# Patient Record
Sex: Female | Born: 1950 | Race: Black or African American | Hispanic: No | Smoking: Never smoker
Health system: Southern US, Community
[De-identification: ages and names within clinical notes are randomized; demographics above are authoritative.]

## PROBLEM LIST (undated history)

## (undated) DIAGNOSIS — I2510 Atherosclerotic heart disease of native coronary artery without angina pectoris: Secondary | ICD-10-CM

## (undated) DIAGNOSIS — K921 Melena: Secondary | ICD-10-CM

## (undated) DIAGNOSIS — L309 Dermatitis, unspecified: Secondary | ICD-10-CM

## (undated) DIAGNOSIS — K219 Gastro-esophageal reflux disease without esophagitis: Secondary | ICD-10-CM

## (undated) DIAGNOSIS — I10 Essential (primary) hypertension: Secondary | ICD-10-CM

## (undated) DIAGNOSIS — M8580 Other specified disorders of bone density and structure, unspecified site: Secondary | ICD-10-CM

## (undated) DIAGNOSIS — D649 Anemia, unspecified: Secondary | ICD-10-CM

## (undated) DIAGNOSIS — K649 Unspecified hemorrhoids: Secondary | ICD-10-CM

## (undated) DIAGNOSIS — M1990 Unspecified osteoarthritis, unspecified site: Secondary | ICD-10-CM

## (undated) DIAGNOSIS — Z9289 Personal history of other medical treatment: Secondary | ICD-10-CM

## (undated) DIAGNOSIS — E785 Hyperlipidemia, unspecified: Secondary | ICD-10-CM

## (undated) HISTORY — DX: Atherosclerotic heart disease of native coronary artery without angina pectoris: I25.10

## (undated) HISTORY — DX: Melena: K92.1

## (undated) HISTORY — DX: Essential (primary) hypertension: I10

## (undated) HISTORY — DX: Hyperlipidemia, unspecified: E78.5

## (undated) HISTORY — DX: Unspecified hemorrhoids: K64.9

## (undated) HISTORY — DX: Unspecified osteoarthritis, unspecified site: M19.90

## (undated) HISTORY — DX: Anemia, unspecified: D64.9

## (undated) HISTORY — DX: Gastro-esophageal reflux disease without esophagitis: K21.9

## (undated) HISTORY — PX: ESOPHAGOGASTRODUODENOSCOPY: SHX1529

## (undated) HISTORY — DX: Other specified disorders of bone density and structure, unspecified site: M85.80

## (undated) HISTORY — DX: Personal history of other medical treatment: Z92.89

## (undated) HISTORY — DX: Dermatitis, unspecified: L30.9

## (undated) HISTORY — PX: ABDOMINAL HYSTERECTOMY: SHX81

## (undated) HISTORY — PX: COLONOSCOPY: SHX174

---

## 1998-02-23 ENCOUNTER — Other Ambulatory Visit: Admission: RE | Admit: 1998-02-23 | Discharge: 1998-02-23 | Payer: Self-pay | Admitting: Obstetrics and Gynecology

## 1999-03-14 ENCOUNTER — Other Ambulatory Visit: Admission: RE | Admit: 1999-03-14 | Discharge: 1999-03-14 | Payer: Self-pay | Admitting: Obstetrics and Gynecology

## 2000-07-29 ENCOUNTER — Other Ambulatory Visit: Admission: RE | Admit: 2000-07-29 | Discharge: 2000-07-29 | Payer: Self-pay | Admitting: Obstetrics and Gynecology

## 2001-07-10 ENCOUNTER — Other Ambulatory Visit: Admission: RE | Admit: 2001-07-10 | Discharge: 2001-07-10 | Payer: Self-pay | Admitting: Obstetrics and Gynecology

## 2002-11-05 ENCOUNTER — Other Ambulatory Visit: Admission: RE | Admit: 2002-11-05 | Discharge: 2002-11-05 | Payer: Self-pay | Admitting: Obstetrics and Gynecology

## 2004-10-07 ENCOUNTER — Encounter: Admission: RE | Admit: 2004-10-07 | Discharge: 2004-10-07 | Payer: Self-pay | Admitting: Family Medicine

## 2005-03-04 ENCOUNTER — Inpatient Hospital Stay (HOSPITAL_COMMUNITY): Admission: EM | Admit: 2005-03-04 | Discharge: 2005-03-07 | Payer: Self-pay | Admitting: Emergency Medicine

## 2005-03-05 ENCOUNTER — Encounter (INDEPENDENT_AMBULATORY_CARE_PROVIDER_SITE_OTHER): Payer: Self-pay | Admitting: *Deleted

## 2005-12-18 ENCOUNTER — Ambulatory Visit: Payer: Self-pay | Admitting: Family Medicine

## 2006-04-23 ENCOUNTER — Ambulatory Visit: Payer: Self-pay | Admitting: Family Medicine

## 2006-07-04 ENCOUNTER — Ambulatory Visit: Payer: Self-pay | Admitting: Family Medicine

## 2006-07-09 ENCOUNTER — Encounter: Admission: RE | Admit: 2006-07-09 | Discharge: 2006-07-09 | Payer: Self-pay | Admitting: Family Medicine

## 2006-07-09 DIAGNOSIS — Z9289 Personal history of other medical treatment: Secondary | ICD-10-CM

## 2006-07-09 HISTORY — DX: Personal history of other medical treatment: Z92.89

## 2007-01-16 ENCOUNTER — Ambulatory Visit: Payer: Self-pay | Admitting: Family Medicine

## 2007-02-17 ENCOUNTER — Ambulatory Visit: Payer: Self-pay | Admitting: Family Medicine

## 2007-06-02 ENCOUNTER — Ambulatory Visit: Payer: Self-pay | Admitting: Family Medicine

## 2007-07-30 ENCOUNTER — Ambulatory Visit: Payer: Self-pay | Admitting: Family Medicine

## 2007-08-20 ENCOUNTER — Ambulatory Visit: Payer: Self-pay | Admitting: Family Medicine

## 2008-05-11 ENCOUNTER — Ambulatory Visit: Payer: Self-pay | Admitting: Family Medicine

## 2008-11-16 ENCOUNTER — Encounter: Admission: RE | Admit: 2008-11-16 | Discharge: 2008-11-16 | Payer: Self-pay | Admitting: Family Medicine

## 2009-07-29 ENCOUNTER — Ambulatory Visit: Payer: Self-pay | Admitting: Family Medicine

## 2009-08-15 ENCOUNTER — Ambulatory Visit: Payer: Self-pay | Admitting: Family Medicine

## 2009-10-18 ENCOUNTER — Ambulatory Visit: Payer: Self-pay | Admitting: Family Medicine

## 2010-07-31 ENCOUNTER — Encounter (INDEPENDENT_AMBULATORY_CARE_PROVIDER_SITE_OTHER): Payer: BC Managed Care – PPO | Admitting: Family Medicine

## 2010-07-31 DIAGNOSIS — J309 Allergic rhinitis, unspecified: Secondary | ICD-10-CM

## 2010-07-31 DIAGNOSIS — M899 Disorder of bone, unspecified: Secondary | ICD-10-CM

## 2010-07-31 DIAGNOSIS — Z0000 Encounter for general adult medical examination without abnormal findings: Secondary | ICD-10-CM

## 2010-07-31 DIAGNOSIS — I10 Essential (primary) hypertension: Secondary | ICD-10-CM

## 2010-07-31 DIAGNOSIS — R0609 Other forms of dyspnea: Secondary | ICD-10-CM

## 2010-07-31 DIAGNOSIS — R0989 Other specified symptoms and signs involving the circulatory and respiratory systems: Secondary | ICD-10-CM

## 2010-07-31 DIAGNOSIS — M949 Disorder of cartilage, unspecified: Secondary | ICD-10-CM

## 2010-09-22 NOTE — H&P (Signed)
NAME:  [name] [name], [name] [name]                ACCOUNT NO.:  [id number]   MEDICAL RECORD NO.:  [id number]          PATIENT TYPE:  EMS   LOCATION:  MAJO                         FACILITY:  [facility]   PHYSICIAN:  Mobolaji B. Bakare, M.D.DATE OF BIRTH:  [birth year]   DATE OF ADMISSION:  03/03/2005  DATE OF DISCHARGE:                                HISTORY & PHYSICAL   Primary care physician:  Dr. Susann Givens.   CHIEF COMPLAINT:  Chest pain, started about 8:00 a.m. yesterday.   HISTORY OF PRESENTING COMPLAINT:  [name] [name] is a 60 year old African-  American [name] with history of hypertension and hyperlipidemia.  She was in  her usual state until about 8:00 a.m. yesterday morning.  After she had  woken up, she started experiencing retrosternal chest pain which she rated  as 5/10.  She took some Tums and it got better, a little bit, but still  lingered on until about 2:00 p.m. and became worse again and she decided to  come to the emergency department.  It was associated with a feeling of  numbness in her right arm and right side of face.  There was no nausea or  vomiting, palpitations or shortness of breath.  She exercises regularly and  does some aerobics.  She has been able to lose about 16 pounds since May  from regular exercise.  This is intended to help her control  hypercholesterolemia.  She has a brother who had a myocardial infarction at  the age of 16 and mom died of MI at the age of 35.   Currently, she rates the chest pain as 2/10.  EKG is normal and cardiac  enzymes at the point of care are normal.   REVIEW OF SYSTEMS:  HEENT:  No headaches.  RESPIRATORY:  No shortness of  breath or dyspnea on exertion.  GASTROINTESTINAL:  No abdominal pain,  diarrhea, constipation, vomiting.   PAST MEDICAL HISTORY:  1.  Hypertension.  This has been similarly uncontrolled in the last couple      of weeks.  Her blood pressure has been 188/104 to 170/105.  She has had      to double up on Ziac today.  2.   History of asthma in childhood.  3.  History of blood clots at the age of 89.  Could not really give      understandable details of this episode.  4.  Hyperlipidemia.  Using diet to control.   PAST SURGICAL HISTORY:  None.   MEDICATIONS:  Ziac 2.5/6.25 one p.o. daily.   ALLERGIES:  PENICILLIN, unclear the type of allergy.   FAMILY HISTORY:  Father is deceased.  He died from throat cancer.  He was a  smoker.  Mother died from myocardial infarction at the age of __5.  One  brother has had a myocardial infarction at the age of 24.  Two of her  siblings are diabetic and hypertensive.   SOCIAL HISTORY:  She does not smoke cigarettes.  Does not drink alcohol.  She exercises regularly.  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   VITALS:  Temperature 98.6, blood pressure 193/113, rechecked, it was  170/100.  Pulse of 55, respiratory rate of 20 with O2 SATs of 98%.   PHYSICAL EXAMINATION:  GENERAL:  She is alert and oriented to time, place  and person.  HEENT:  Normocephalic, atraumatic head.  Pupils equal, round and reactive to  light.  Extraocular muscle movement intact.  NECK:  No carotid bruit.  No elevated JVD.  LUNGS:  Clear clinically to auscultation.  CARDIOVASCULAR:  S1, S2.  Regular.  No murmur, no rub.  ABDOMEN:  Nondistended.  Soft, nontender.  No hepatosplenomegaly.  Bowel  sounds present.  EXTREMITIES:  No cyanosis, no pedal edema.  Dorsalis pedis pulses palpable  bilaterally, 2+.  CENTRAL NERVOUS SYSTEM:  No focal neurological deficit.   INITIAL LABORATORY DATA:  White cells 4.7, hemoglobin 13.9, hematocrit 40.7,  MCV 90.1, platelets 235, neutrophils 39%, lymphocytes 45%.  Troponin less  than 0.5.  CK-MB less than 1.  Sodium 156, potassium 4.1, chloride 104,  bicarb 29, BUN 15, creatinine 1.1, calcium 9.2.  Total protein 6.5.  Albumin  4.2.  AST 19, ALT 14.  Alkaline phosphatase 61, total bilirubin 0.6.  Lipase  30.   Chest  x-ray:  No acute abnormality.   EKG:  Normal sinus rhythm, heart rate of 61.   ASSESSMENT AND PLAN:  1.  [name] [name] with history of      hypertension, hyperlipidemia and family history of myocardial      infarction, presenting with atypical chest pain.  Will admit to      telemetry and rule out myocardial infarction with serial cardiac      enzymes, better control blood pressure.  Add aspirin 325 mg p.o. daily.      Will give Protonix 40 mg p.o. daily for possibility of reflux as a cause      of chest pain.  Will start on nitroglycerin paste, 1/2 inch q.4h.      Obtain two-dimensional echocardiogram.  If ruled out for myocardial      infarction, would consider Cardiolite stress test.  2.  Hypertension.  This is uncontrolled.  She is currently on Ziac 2.5/6.25.      Heart rate is low-normal.  Will not increase Ziac dose.  Will add      lisinopril 40 mg p.o. daily.  3.  Hyperlipidemia.  Chest fasting lipid profile.      Mobolaji B. Corky Downs, M.D.  Electronically Signed     MBB/MEDQ  D:  03/04/2005  T:  03/04/2005  Job:  [id number]   cc:   Sharlot Gowda, M.D.  Fax: [phone]

## 2010-09-22 NOTE — Cardiovascular Report (Signed)
NAME:  [name], [name]                ACCOUNT NO.:  [id number]   MEDICAL RECORD NO.:  [id number]          PATIENT TYPE:  INP   LOCATION:  5524                         FACILITY:  [facility]   PHYSICIAN:  Cristy Hilts. Jacinto Halim, MD       DATE OF BIRTH:  1951-04-14   DATE OF PROCEDURE:  03/04/2005  DATE OF DISCHARGE:                              CARDIAC CATHETERIZATION   REFERRING PHYSICIAN:  Sharlot Gowda, M.D.   PROCEDURE PREFORMED:  1.  Left ventriculography.  2.  Selective right and left coronary arteriography.  3.  Abdominal aortogram.  4.  Right femoral angiography.  5.  Closure of right femoral access with StarClose.   INDICATIONS:  [name] [name] is a 60 year old female with a history of  hyperlipidemia, hypertension, family history of premature coronary artery  disease, who was admitted to the hospital with chest pain.  She was referred  for cardiac catheterization for a definitive diagnosis of coronary disease.   HEMODYNAMIC DATA:  1.  The left ventricular pressures were 177/6 with an end diastolic pressure      of 13-mmHg.  2.  The aortic pressures were 177/99 with a mean of 133-mmHg.  There was no      pressure gradient across the aortic valve.   ANGIOGRAPHIC DATA:  1.  Left ventricle.  The left ventricular systolic function was normal and      the ejection fraction was estimated at 60-65%.  There was no significant      mitral regurgitation.  2.  Right coronary artery.  The right coronary artery is a large caliber      vessel, dominant vessel.  It gives origin to a large PDA.  It is normal.  3.  Left main.  The left main is a large caliber vessel.  It is normal.  4.  Circumflex.  The circumflex is a large caliber vessel.  It is smooth.      It gives origin to a high obtuse marginal-1 and a moderate to large size      obtuse marginal-2 and continues as an obtuse marginal-3.  The obtuse      marginal-2 has a 20% ostial stenosis.  5.  LAD.  The LAD is a large caliber vessel in the  proximal segment.  It      gives origin to a very small diagonal-1 which is severely diffusely      diseased with the ostial 40-50% stenosis.  The distal LAD has diffuse      luminal irregularity constituting 30-50% luminal irregularity with mild      haziness distally.  The LAD also gives origin to a moderate size      diagonal-2.   ABDOMINAL AORTOGRAM:  Abdominal aortogram revealed the presence of two renal  arteries on either side.  They were widely patent.   IMPRESSIONS:  1.  Normal left ventricular systolic function, ejection fraction 65%.  2.  Moderate diffuse disease, especially of the left anterior descending      artery, constituting 30-50% stenosis in the mid to distal left anterior  descending artery.  Mild haziness is also noted in one of the distal      lesions, however, there is no evidence of ulcerative lesion or a      thrombotic lesion.  Diagonal-1 is very small and diffusely diseased.  3.  Patent renal arteries.   RECOMMENDATIONS:  The patient has uncontrolled hypertension, uncontrolled  hyperlipidemia with an LDL of 200.  She is the one that is extremely high  risk for progression of coronary artery disease, unstable angina/myocardial  infarction in future.  Given the diffuse nature of her disease and  noncritical disease especially of the LAD, continued aggressive risk factor  modification with beta-blockers, ACE inhibitors, aspirin, and lipid-lowering  is indicated with a goal of LDL less than 100, potentially closer to 70.  The patient can be discharged home and will have an outpatient followup with  Dr. Julieanne Manson.  The patient has been started on Metoprolol 25 b.i.d.,  along with Vytorin 10/40 p.o. q.h.s., along with Lisinopril.   A total of 90 cc of contrast was utilized for diagnostic angiography.  No  immediate complications.   TECHNIQUE OF THE PROCEDURE:  Under usual sterile precautions using a 6-  French right femoral arterial access, a 6-French  multiple B2 catheter was  advanced into the ascending aorta over a 0.035 inch wire.  The catheter was  gently advanced into the left ventricle and left ventricular pressures were  monitored.  Hand contrast injection into the left ventricle was performed  both in LAO and RAO projection.  Catheter was flushed with saline, pulled  back into the ascending aorta and pressure gradient across the aortic valve  was monitored.  The right coronary artery was selectively engaged and  angiography was performed.  In a similar fashion, the left coronary artery  was selectively engaged and angiography was performed.  Then the catheter  was pulled back into the abdominal aorta and abdominal aortogram was  performed.  Then the catheter was pulled out of the body in the usual  fashion and right femoral angiography was performed through the arterial  access sheath and the access was closed with StarClose with excellent  hemostasis.  No immediate complications noted.      Cristy Hilts. Jacinto Halim, MD  Electronically Signed     JRG/MEDQ  D:  03/06/2005  T:  03/06/2005  Job:  [id number]   cc:   Sharlot Gowda, M.D.  Fax: [phone]   Thereasa Solo. Little, M.D.  Fax: [phone]

## 2010-09-22 NOTE — Discharge Summary (Signed)
NAME:  [name] [name], [name] [name]                ACCOUNT NO.:  [id number]   MEDICAL RECORD NO.:  [id number]          PATIENT TYPE:  INP   LOCATION:  5524                         FACILITY:  [facility]   PHYSICIAN:  Hettie Holstein, D.O.    DATE OF BIRTH:  1951-02-01   DATE OF ADMISSION:  03/03/2005  DATE OF DISCHARGE:  03/07/2005                                 DISCHARGE SUMMARY   PRIMARY CARE PHYSICIAN:  Sharlot Gowda, M.D.   CARDIOLOGIST:  Cristy Hilts. Jacinto Halim, M.D.   PRINCIPAL DIAGNOSIS:  Chest pain.   DIAGNOSES AT TIME OF DISCHARGE:  1.  Chest pain without evidence of acute ischemic event during this      hospitalization, status post cardiac catheterization revealing      nonobstructive coronary disease, no interventions performed.  2.  Hyperlipidemia.  3.  Hypertension.   MEDICATIONS ON DISCHARGE:  1.  Ziac 2.5/6.25 mg one daily as before.  2.  Iron supplement as before.  3.  Allegra as before.  4.  Claritin as before.  5.  Aspirin as before.  6.  Lisinopril 40 mg daily.  This was new.  7.  Zocor 20 mg daily.   A follow-up comprehensive metabolic panel within one week of discharge.   DISPOSITION:  She was instructed to follow up with Dr. Jacinto Halim on November 20  at 11:45 and with Dr. Susann Givens at 11:15 on November 8.   HISTORY OF PRESENTING ILLNESS:  For full details, please refer to the H&P as  dictated by Mobolaji B. Corky Downs, M.D.  However, briefly, Ms. [name] [name] is a 60-  year-old African-American female with a history of hypertension and  hyperlipidemia, who was in her usual state of health until around 8 o'clock  the morning previous to admission.  After she had woken up, she started  experiencing retrosternal chest pain, which she rated as a 5/10.  She took  some Tums and it improved and still lingered until around 2 p.m. and became  worse, and she decided to come to the emergency department.  This was  associated with a feeling of numbness in her right arm and right side of her  face.  There was  no nausea or vomiting, palpitations or shortness of breath.  She reported regular exercise and aerobics.  She had been able to lose about  16 pounds since May with exercise.  Her brother had a history of MI at age  45, and her mother died with an MI at age 75.  Her EKG on presentation was  normal, and point of care markers were unremarkable.   HOSPITAL COURSE:  [name] was admitted for a rule-out of acute ischemic  event.  She underwent cardiology consultation with [facility], who performed cardiac catheterization, and underwent cycling of  her cardiac markers as well as 2-D echocardiogram.  The 2-D echocardiogram  revealed normal LV systolic function with ejection fraction of 55-65% and no  significant valvular abnormalities were prominent.  Her point of care  markers were negative.  Her lipid profile revealed a total cholesterol of  281,  LDL cholesterol of 200, HDL of 63, and her BUN was 13 and creatinine  was 1.1.  Cardiac catheterization performed revealed a nonobstructive  coronary disease and she was scheduled for follow-up in the outpatient  setting with cardiology.  She was discharged in medically stable condition.      Hettie Holstein, D.O.  Electronically Signed     ESS/MEDQ  D:  05/15/2005  T:  05/16/2005  Job:  [id number]

## 2010-09-27 ENCOUNTER — Other Ambulatory Visit: Payer: Self-pay | Admitting: Family Medicine

## 2010-10-30 ENCOUNTER — Other Ambulatory Visit: Payer: Self-pay | Admitting: Family Medicine

## 2010-11-14 ENCOUNTER — Other Ambulatory Visit: Payer: Self-pay | Admitting: Obstetrics and Gynecology

## 2010-11-14 DIAGNOSIS — R928 Other abnormal and inconclusive findings on diagnostic imaging of breast: Secondary | ICD-10-CM

## 2010-11-15 ENCOUNTER — Ambulatory Visit
Admission: RE | Admit: 2010-11-15 | Discharge: 2010-11-15 | Disposition: A | Discharge status: Home / Self Care | Payer: BC Managed Care – PPO | Source: Ambulatory Visit | Attending: Obstetrics and Gynecology | Admitting: Obstetrics and Gynecology

## 2010-11-15 DIAGNOSIS — R928 Other abnormal and inconclusive findings on diagnostic imaging of breast: Secondary | ICD-10-CM

## 2010-11-28 ENCOUNTER — Other Ambulatory Visit: Payer: Self-pay | Admitting: Family Medicine

## 2011-01-05 ENCOUNTER — Other Ambulatory Visit: Payer: Self-pay | Admitting: Family Medicine

## 2011-02-07 ENCOUNTER — Other Ambulatory Visit: Payer: Self-pay | Admitting: Family Medicine

## 2011-02-26 ENCOUNTER — Encounter: Payer: Self-pay | Admitting: Medical

## 2011-02-26 ENCOUNTER — Ambulatory Visit (INDEPENDENT_AMBULATORY_CARE_PROVIDER_SITE_OTHER): Payer: BC Managed Care – PPO | Admitting: Medical

## 2011-02-26 ENCOUNTER — Encounter: Payer: Self-pay | Admitting: Family Medicine

## 2011-02-26 VITALS — BP 132/82 | HR 60 | Temp 98.4°F | Resp 16 | Ht 65.5 in | Wt 192.5 lb

## 2011-02-26 DIAGNOSIS — K921 Melena: Secondary | ICD-10-CM

## 2011-02-26 DIAGNOSIS — R6881 Early satiety: Secondary | ICD-10-CM

## 2011-02-26 DIAGNOSIS — R109 Unspecified abdominal pain: Secondary | ICD-10-CM

## 2011-02-26 NOTE — Progress Notes (Signed)
Subjective:   HPI  [name] [name] is a 60 y.o. female who presents for 2 week hx/o stomach discomfort, gets feeling of stomach fullness, early satiety with water, minimal food intake, no appetite but at times feels hunger.  She notes 5 lb weight loss unexpected over the weekend.   She has hx/o constipation, but has been controlled with daily stool softeners.  She has hx/o GERD but this is controlled on Nexium.  She says these symptoms don't seem like her prior GERD symptoms . She is worried because her husband died of stomach cancer and had similar symptoms.  She had EGD and Colonoscopy 2008 with Dr. Loreta Ave.  She wasn't sure when she was suppose to repeat this.  She notes that she has been treated for H. Pylori prior.  She denies alcohol or tobacco use.  No recent medication changes.  Denies difficulty swallowing.   No coughing or vomiting blood.  However, she had a few episodes of blood in stool in 3/12 that was attributed to hemorrhoids, but in the last few months, she has been seeing frank blood on the toilet paper and in the stool daily.  No other aggravating or relieving factors.    No other c/o.  Review of Systems Constitutional: denies fever,- chills, sweats,+ unexpected weight change, +fatigue ENT: no runny nose, ear pain, sore throat Cardiology: + chest pain,- palpitations,- edema Respiratory: denies cough, shortness of breath, wheezing Gastroenterology:  +nausea,denies abdominal pains, vomiting, diarrhea, constipation Hematology: denies bleeding or bruising problems Musculoskeletal: denies arthralgias, myalgias, joint swelling, back pain Ophthalmology: + vision changes Urology: denies dysuria, difficulty urinating, hematuria, urinary frequency, urgency Neurology: no headache,+ weakness,- tingling-, numbness    Allergies  Allergen Reactions  . Penicillins    Pertinent past medical history: Hemorrhoids, constipation, GERD, EGD and Colonoscopy 2008, Dr. Loreta Ave  Pertinent social history:  husband died few years ago of stomach cancer, otherwise negative.  Reviewed their medical, surgical, family, social, medication, and allergy history and updated chart as appropriate.    Objective:   Physical Exam  Filed Vitals:   02/26/11 1211  BP: 132/82  Pulse: 60  Temp: 98.4 F (36.9 C)  Resp: 16    General appearance: alert, no distress, WD/WN, black female HEENT: normocephalic, sclerae anicteric, TMs pearly, nares patent, no discharge or erythema, pharynx normal Oral cavity: MMM, no lesions Neck: supple, no lymphadenopathy, no thyromegaly, no masses Heart: RRR, normal S1, S2, no murmurs Lungs: CTA bilaterally, no wheezes, rhonchi, or rales Abdomen: +bs, soft, mild generalized tenderness, non distended, no masses, no hepatomegaly, no splenomegaly Pulses: 2+ symmetric, upper and lower extremities, normal cap refill   Assessment and Plan :    Encounter Diagnoses  Name Primary?  . Abdominal discomfort Yes  . Blood in stool   . Early satiety    I reviewed her EGD and colonoscopy results from 2008 which were relatively unremarkable.  She has been continued on Nexium with good improvement of GERD.  She has continued to have constipation that is relieved with stool softeners daily.  However, she has new symptoms of weight loss, early satiety, anorexia, 5lb weight loss in the last 2 weeks, and hx/o increasing regularity if blood in stool for months.  Thus, labs today and referral back to Dr. Lavonia Drafts.    Follow-up in with referral to GI.

## 2011-02-26 NOTE — Progress Notes (Signed)
  Subjective:    Patient ID: [name] [name], female    DOB: April 22, 1951, 60 y.o.   MRN: [id number]  HPI    Review of Systems     Objective:   Physical Exam        Assessment & Plan:

## 2011-02-26 NOTE — Patient Instructions (Signed)
We will call with lab results and referral information.

## 2011-02-27 LAB — COMPREHENSIVE METABOLIC PANEL
ALT: 20 U/L (ref 0–35)
AST: 23 U/L (ref 0–37)
Albumin: 4.8 g/dL (ref 3.5–5.2)
Alkaline Phosphatase: 71 U/L (ref 39–117)
BUN: 14 mg/dL (ref 6–23)
CO2: 25 mEq/L (ref 19–32)
Calcium: 10 mg/dL (ref 8.4–10.5)
Chloride: 106 mEq/L (ref 96–112)
Creat: 1.08 mg/dL (ref 0.50–1.10)
Glucose, Bld: 92 mg/dL (ref 70–99)
Potassium: 4.5 mEq/L (ref 3.5–5.3)
Sodium: 142 mEq/L (ref 135–145)
Total Bilirubin: 0.6 mg/dL (ref 0.3–1.2)
Total Protein: 7.1 g/dL (ref 6.0–8.3)

## 2011-02-27 LAB — CBC WITH DIFFERENTIAL/PLATELET
Basophils Absolute: 0 10*3/uL (ref 0.0–0.1)
Basophils Relative: 1 % (ref 0–1)
Eosinophils Absolute: 0.2 10*3/uL (ref 0.0–0.7)
Eosinophils Relative: 4 % (ref 0–5)
HCT: 38 % (ref 36.0–46.0)
Hemoglobin: 12.3 g/dL (ref 12.0–15.0)
Lymphocytes Relative: 42 % (ref 12–46)
Lymphs Abs: 2.2 10*3/uL (ref 0.7–4.0)
MCH: 29.1 pg (ref 26.0–34.0)
MCHC: 32.4 g/dL (ref 30.0–36.0)
MCV: 90 fL (ref 78.0–100.0)
Monocytes Absolute: 0.4 10*3/uL (ref 0.1–1.0)
Monocytes Relative: 8 % (ref 3–12)
Neutro Abs: 2.4 10*3/uL (ref 1.7–7.7)
Neutrophils Relative %: 45 % (ref 43–77)
Platelets: 258 10*3/uL (ref 150–400)
RBC: 4.22 MIL/uL (ref 3.87–5.11)
RDW: 14.4 % (ref 11.5–15.5)
WBC: 5.2 10*3/uL (ref 4.0–10.5)

## 2011-03-09 LAB — HM COLONOSCOPY

## 2011-04-02 ENCOUNTER — Ambulatory Visit (INDEPENDENT_AMBULATORY_CARE_PROVIDER_SITE_OTHER): Payer: BC Managed Care – PPO | Admitting: Surgery

## 2011-04-11 ENCOUNTER — Ambulatory Visit (INDEPENDENT_AMBULATORY_CARE_PROVIDER_SITE_OTHER): Payer: BC Managed Care – PPO | Admitting: General Surgery

## 2011-04-11 ENCOUNTER — Encounter (INDEPENDENT_AMBULATORY_CARE_PROVIDER_SITE_OTHER): Payer: Self-pay | Admitting: General Surgery

## 2011-04-11 VITALS — BP 148/88 | HR 68 | Temp 98.4°F | Resp 16 | Ht 66.0 in | Wt 198.4 lb

## 2011-04-11 DIAGNOSIS — K648 Other hemorrhoids: Secondary | ICD-10-CM

## 2011-04-11 NOTE — Progress Notes (Signed)
Patient ID: [name] [name], female   DOB: 04-07-1951, 60 y.o.   MRN: [id number]  Chief Complaint  Patient presents with  . New Evaluation    eval of hemorrhoids     HPI [name] [name] is a 60 y.o. female.   HPI This patient is referred by Dr. Loreta Ave for evaluation of internal and external hemorrhoids. These are symptomatic with some occasional discomfort but her main complaint is bleeding which occurs weekly or biweekly. She describes this as bright blood mainly while wiping with some dripping into the bowl. She reports a heavy episode of bleeding earlier in the year in the spring which saturated some paper towels which had been placed in her pants. She takes a stool softener daily for her bowels but if she does not take a stool softener she is constipated. She takes a stool softener she moves her bowels daily and normal caliber. She was also given a high fiber diet recommendation by Dr. Loreta Ave and she started to take more fiber. She has a history of reflux and recently underwent EGD which was reported normal as well as a colonoscopy and she reports 2 polyps being removed which were nonmalignant.  Past Medical History  Diagnosis Date  . Allergy   . Arthritis   . Hypertension   . Hyperlipidemia   . Osteopenia   . ASHD (arteriosclerotic heart disease)   . Eczema   . GERD (gastroesophageal reflux disease)   . Hemorrhoid   . Clotting disorder   . Anemia     Past Surgical History  Procedure Date  . Abdominal hysterectomy   . Colonoscopy 2008    Dr. Loreta Ave  . Esophagogastroduodenoscopy 2008    Dr. Loreta Ave    Family History  Problem Relation Age of Onset  . Heart disease Mother   . Cancer Father     lung    Social History History  Substance Use Topics  . Smoking status: Never Smoker   . Smokeless tobacco: Never Used  . Alcohol Use: No    Allergies  Allergen Reactions  . Penicillins     Current Outpatient Prescriptions  Medication Sig Dispense Refill  . atorvastatin (LIPITOR) 40  MG tablet Take 40 mg by mouth daily.        . bisoprolol-hydrochlorothiazide (ZIAC) 5-6.25 MG per tablet Take 1 tablet by mouth daily.        Marland Kitchen DEXILANT 60 MG capsule       . felodipine (PLENDIL) 10 MG 24 hr tablet TAKE 1 TABLET BY MOUTH ONCE DAILY  30 tablet  9  . Multiple Vitamin (MULTIVITAMIN) capsule Take 1 capsule by mouth daily.        Rolene Arbour BOWEL PREP SOLN         Review of Systems Review of Systems  Gastrointestinal: Positive for constipation.  Hematological: Bruises/bleeds easily.  All other systems reviewed and are negative.    Blood pressure 148/88, pulse 68, temperature 98.4 F (36.9 C), temperature source Temporal, resp. rate 16, height 5\' 6"  (1.676 m), weight 198 lb 6 oz (89.982 kg).  Physical Exam Physical Exam  Vitals reviewed. Constitutional: She is oriented to person, place, and time. She appears well-developed and well-nourished. No distress.  HENT:  Head: Normocephalic and atraumatic.  Eyes: Conjunctivae are normal. Pupils are equal, round, and reactive to light. No scleral icterus.  Neck: Normal range of motion. No tracheal deviation present.  Pulmonary/Chest: Effort normal. No stridor. No respiratory distress.  Abdominal: Soft. Bowel sounds are  normal. She exhibits no distension and no mass. There is no tenderness. There is no rebound and no guarding.  Genitourinary:       She has circumferential internal and external hemorrhoid disease but no evidence of active bleeding or thrombosis. No evidence of abscess or anal fissure. Anoscopic exam did reveal internal hemorrhoids but no internal masses.  Musculoskeletal: Normal range of motion. She exhibits no edema and no tenderness.  Neurological: She is alert and oriented to person, place, and time.  Skin: Skin is warm and dry. No rash noted. She is not diaphoretic. No erythema. No pallor.  Psychiatric: She has a normal mood and affect. Her behavior is normal. Judgment and thought content normal.    Data  Reviewed   Assessment    internal and external hemorrhoids with bleeding  She certainly has notable internal and external hemorrhoids with intermittent bleeding. She is not very symptomatic otherwise. She has circumferential disease which complicates the surgical management of this problem. I do not think that she would be a good candidate for open hemorrhoidectomy unless this was done as a staged procedure. She would be at high risk for stricture if this were done. She would likely be a candidate for a stapled hemorrhoidectomy however, I'm not sure if she would get good enough resolution of her external component with this. She has just recently started a high-fiber diet as prescribed by her gastroenterologist which I agree is key for management of this.we discussed medical management as well as surgical options and I think that she is leaning toward continued conservative management at this time.    Plan    She will continue with high-fiber diet and he recommended 20-30 g of fiber daily, increased water intake, and continued stool softeners. She was offered surgical management since she has already completed some conservative treatments, but at this time she is going to continue with medical management. If she continues to be symptomatic or if bleeding persists or if she changes her mind, we would be happy to discuss surgical options again. I recommended that she follow up in 4 weeks if her symptoms do not really improved with medical treatment.       [name] 04/11/2011, 9:29 AM

## 2011-06-13 ENCOUNTER — Other Ambulatory Visit: Payer: Self-pay | Admitting: Family Medicine

## 2011-08-17 ENCOUNTER — Telehealth: Payer: Self-pay | Admitting: Family Medicine

## 2011-08-17 DIAGNOSIS — I10 Essential (primary) hypertension: Secondary | ICD-10-CM

## 2011-08-17 MED ORDER — BISOPROLOL-HYDROCHLOROTHIAZIDE 5-6.25 MG PO TABS: 1.0000 | ORAL_TABLET | Freq: Every day | ORAL | Status: DC

## 2011-08-17 NOTE — Telephone Encounter (Signed)
Done

## 2011-09-04 ENCOUNTER — Other Ambulatory Visit: Payer: Self-pay | Admitting: Family Medicine

## 2011-10-06 LAB — HM DEXA SCAN

## 2011-10-06 LAB — HM MAMMOGRAPHY: HM Mammogram: NORMAL

## 2011-10-09 ENCOUNTER — Other Ambulatory Visit: Payer: Self-pay | Admitting: Family Medicine

## 2011-10-11 ENCOUNTER — Telehealth: Payer: Self-pay | Admitting: Family Medicine

## 2011-10-11 MED ORDER — ATORVASTATIN CALCIUM 40 MG PO TABS: 40.0000 mg | ORAL_TABLET | Freq: Every day | ORAL | Status: DC

## 2011-10-11 NOTE — Telephone Encounter (Signed)
Filled lipitor 40mg  with only a 30 day supply to [facility]

## 2011-10-16 ENCOUNTER — Encounter: Payer: Self-pay | Admitting: Medical

## 2011-10-16 ENCOUNTER — Ambulatory Visit (INDEPENDENT_AMBULATORY_CARE_PROVIDER_SITE_OTHER): Payer: BC Managed Care – PPO | Admitting: Medical

## 2011-10-16 VITALS — BP 142/90 | HR 60 | Temp 98.1°F | Resp 16 | Ht 65.25 in | Wt 185.0 lb

## 2011-10-16 DIAGNOSIS — Z23 Encounter for immunization: Secondary | ICD-10-CM

## 2011-10-16 DIAGNOSIS — K649 Unspecified hemorrhoids: Secondary | ICD-10-CM

## 2011-10-16 DIAGNOSIS — I10 Essential (primary) hypertension: Secondary | ICD-10-CM

## 2011-10-16 DIAGNOSIS — K219 Gastro-esophageal reflux disease without esophagitis: Secondary | ICD-10-CM | POA: Insufficient documentation

## 2011-10-16 DIAGNOSIS — Z0000 Encounter for general adult medical examination without abnormal findings: Secondary | ICD-10-CM

## 2011-10-16 DIAGNOSIS — M8580 Other specified disorders of bone density and structure, unspecified site: Secondary | ICD-10-CM | POA: Insufficient documentation

## 2011-10-16 DIAGNOSIS — M899 Disorder of bone, unspecified: Secondary | ICD-10-CM

## 2011-10-16 DIAGNOSIS — E785 Hyperlipidemia, unspecified: Secondary | ICD-10-CM | POA: Insufficient documentation

## 2011-10-16 LAB — CBC WITH DIFFERENTIAL/PLATELET
Basophils Absolute: 0 10*3/uL (ref 0.0–0.1)
Basophils Relative: 1 % (ref 0–1)
Eosinophils Absolute: 0.2 10*3/uL (ref 0.0–0.7)
Eosinophils Relative: 5 % (ref 0–5)
Hemoglobin: 12.5 g/dL (ref 12.0–15.0)
Lymphs Abs: 2.1 10*3/uL (ref 0.7–4.0)
MCH: 28.7 pg (ref 26.0–34.0)
MCHC: 32.1 g/dL (ref 30.0–36.0)
MCV: 89.2 fL (ref 78.0–100.0)
Monocytes Absolute: 0.5 10*3/uL (ref 0.1–1.0)
Monocytes Relative: 8 % (ref 3–12)
Neutro Abs: 2.5 10*3/uL (ref 1.7–7.7)
Neutrophils Relative %: 47 % (ref 43–77)
Platelets: 259 10*3/uL (ref 150–400)
RBC: 4.36 MIL/uL (ref 3.87–5.11)
WBC: 5.4 10*3/uL (ref 4.0–10.5)

## 2011-10-16 LAB — POCT URINALYSIS DIPSTICK
Bilirubin, UA: NEGATIVE
Blood, UA: NEGATIVE
Glucose, UA: NEGATIVE
Ketones, UA: NEGATIVE
Leukocytes, UA: NEGATIVE
Nitrite, UA: NEGATIVE
Protein, UA: NEGATIVE
Urobilinogen, UA: NEGATIVE
pH, UA: 6

## 2011-10-16 MED ORDER — FELODIPINE ER 10 MG PO TB24: 10.0000 mg | ORAL_TABLET | Freq: Every day | ORAL | Status: DC

## 2011-10-16 MED ORDER — ATORVASTATIN CALCIUM 40 MG PO TABS: 40.0000 mg | ORAL_TABLET | Freq: Every day | ORAL | Status: DC

## 2011-10-16 MED ORDER — BISOPROLOL-HYDROCHLOROTHIAZIDE 5-6.25 MG PO TABS: 1.0000 | ORAL_TABLET | Freq: Every day | ORAL | Status: DC

## 2011-10-16 MED ORDER — DEXLANSOPRAZOLE 60 MG PO CPDR: 60.0000 mg | DELAYED_RELEASE_CAPSULE | Freq: Every day | ORAL | Status: DC

## 2011-10-16 NOTE — Patient Instructions (Signed)

## 2011-10-16 NOTE — Progress Notes (Signed)
Addended by: Jac Canavan on: 10/16/2011 08:46 PM   Modules accepted: Orders

## 2011-10-16 NOTE — Progress Notes (Signed)
Subjective:   HPI  [name] [name] is a 61 y.o. female who presents for a complete physical.  Preventative care: Last ophthalmology visit: last week.  Last dental visit: sees every 55mo Last tetanus booster: unsure, >10 years ago Flu vaccine: yearly gynecology - sees gyn this month for mammogram, pap, bone density screening.  Chronic issues: HTN - on medication, compliant but has request to stop some of her medication as she doesn't want to be on so many pills.  Cost is not the issue.  She notes white coat hypertension, but home readings ok.  Hyperlipidemia - compliant with medication, no current problems with the medication, but prior muscle aches and headaches with Zocor and Vytorin.  Osteopenia - sees gynecology for this, takes Ca+D, has been on bisphosphonate prior.   Sees [facility] yearly for cardiac f/u.  Had stress test several years ago.  Went to Dr. Lavonia Drafts last year for hemorrhoids and blood in stool per my request, had repeat EGD and Colonoscopy, ended up seeing general surgery for hemorrhoid consult.  Has both internal and external hemorrhoids.   Reviewed their medical, surgical, family, social, medication, and allergy history and updated chart as appropriate.  Past Medical History  Diagnosis Date  . Allergy   . Arthritis   . Hypertension   . Hyperlipidemia   . Osteopenia   . ASHD (arteriosclerotic heart disease)     [facility] f/u yearly  . Eczema   . GERD (gastroesophageal reflux disease)   . Hemorrhoid     internal and external, general surgery consult 04/2011  . Anemia   . Blood in stool     GI consult 2012, EGD and colonoscopy  . Clotting disorder   . History of echocardiogram 02/17/2008    normal LV structure and function w/ EF >55%, trace MR, TR and PI  . History of nuclear stress test 02/2008    no significant ischemia, low risk scan, post stress EF 68%  . H/O bone density study 07/09/2006    osteopenia; [facility]   Past  Surgical History  Procedure Date  . Abdominal hysterectomy   . Colonoscopy 2012, 2008    Dr. Loreta Ave  . Esophagogastroduodenoscopy 2012, 2008    Dr. Loreta Ave    Family History  Problem Relation Age of Onset  . Heart disease Mother   . Diabetes Mother   . Cancer Father     lung  . Diabetes Father   . Diabetes Sister   . Diabetes Brother   . Heart disease Brother     1 brother died MI age 7yo  . Stroke Neg Hx   . Hypertension Neg Hx   . Hyperlipidemia Neg Hx   . Cancer Other     maternal side,non first degree breast cancer    History   Social History  . Marital Status: Widowed    Spouse Name: N/A    Number of Children: N/A  . Years of Education: N/A   Occupational History  . [employer]    Social History Main Topics  . Smoking status: Never Smoker   . Smokeless tobacco: Never Used  . Alcohol Use: No  . Drug Use: No  . Sexually Active: Not on file   Other Topics Concern  . Not on file   Social History Narrative   Married, exercise - walks daily 5 days per week    Current Outpatient Prescriptions on File Prior to Visit  Medication Sig Dispense Refill  .  atorvastatin (LIPITOR) 40 MG tablet Take 1 tablet (40 mg total) by mouth daily.  30 tablet  0  . bisoprolol-hydrochlorothiazide (ZIAC) 5-6.25 MG per tablet Take 1 tablet by mouth daily.  30 tablet  5  . DEXILANT 60 MG capsule       . felodipine (PLENDIL) 10 MG 24 hr tablet TAKE 1 TABLET BY MOUTH ONCE DAILY  30 tablet  9  . Multiple Vitamin (MULTIVITAMIN) capsule Take 1 capsule by mouth daily.        Rolene Arbour BOWEL PREP SOLN         Allergies  Allergen Reactions  . Penicillins      Review of Systems Constitutional: denies fever, chills, sweats, unexpected weight change, anorexia, fatigue Allergy: negative; denies recent sneezing, itching, congestion Dermatology: denies changing moles, rash, lumps, new worrisome lesions ENT: no runny nose, ear pain, sore throat, hoarseness, sinus pain, teeth pain,  tinnitus, hearing loss, epistaxis Cardiology: denies chest pain, palpitations, edema, orthopnea, paroxysmal nocturnal dyspnea Respiratory: denies cough, shortness of breath, dyspnea on exertion, wheezing, hemoptysis Gastroenterology: denies abdominal pain, nausea, vomiting, diarrhea, constipation, blood in stool, changes in bowel movement, dysphagia Hematology: denies bleeding or bruising problems Musculoskeletal: denies arthralgias, myalgias, joint swelling, back pain, neck pain, cramping, gait changes Ophthalmology: denies vision changes, eye redness, itching, discharge Urology: denies dysuria, difficulty urinating, hematuria, urinary frequency, urgency, incontinence Neurology: no headache, weakness, tingling, numbness, speech abnormality, memory loss, falls, dizziness Psychology: denies depressed mood, agitation, sleep problems     Objective:   Physical Exam  Filed Vitals:   10/16/11 0850  BP: 142/90  Pulse: 60  Temp: 98.1 F (36.7 C)  Resp: 16    General appearance: alert, no distress, WD/WN, AA female Skin: few scattered benign appearing macules, no worrisome lesions.  She declined gown so she was clothed today HEENT: normocephalic, conjunctiva/corneas normal, sclerae anicteric, PERRLA, EOMi, nares patent, no discharge or erythema, pharynx normal Oral cavity: MMM, tongue normal, teeth - upper denture, otherwise in good repair Neck: supple, no lymphadenopathy, no thyromegaly, no masses, normal ROM, no bruits Chest: non tender, normal shape and expansion Heart: RRR, normal S1, S2, no murmurs Lungs: CTA bilaterally, no wheezes, rhonchi, or rales Abdomen: +bs, soft, non tender, non distended, no masses, no hepatomegaly, no splenomegaly, no bruits Back: non tender, normal ROM, no scoliosis Musculoskeletal: upper extremities non tender, no obvious deformity, normal ROM throughout, lower extremities non tender, no obvious deformity, normal ROM throughout Extremities: no edema, no  cyanosis, no clubbing Pulses: 2+ symmetric, upper and lower extremities, normal cap refill Neurological: alert, oriented x 3, CN2-12 intact, strength normal upper extremities and lower extremities, sensation normal throughout, DTRs 2+ throughout, no cerebellar signs, gait normal Psychiatric: normal affect, behavior normal, pleasant  Breast/gyn/rectal - deferred to gynecology   Assessment and Plan :    Encounter Diagnoses  Name Primary?  . Routine general medical examination at a health care facility Yes  . Essential hypertension, benign   . Hyperlipidemia   . Osteopenia   . Need for Tdap vaccination   . Hemorrhoids   . GERD (gastroesophageal reflux disease)   . Need for shingles vaccine    Physical exam - discussed healthy lifestyle, diet, exercise, preventative care, vaccinations, and addressed their concerns.  Handout given. She will f/u as planned this month for mammogram, bone density and routine gynecological f/u.  HTN - she reports white coat syndrome.  She wants to stop some of her BP medication.   I advised she keep a  BP log or readings x 33mo, then bring these to me.  If running low, then we can consider a decrease in medication.   But she likely is normotensive on medication, so thus, will likely leave her current BP regimen unchanged.    Hyperlipidemia - she has failed other statins in the past due to side effects including myalgias and headaches with Zocor and Vytorin.  She has not had a lipid panel since beginning Lipitor.  Thus, labs today. C/t Lipitor.   Osteopenia - managed by gynecology. She will f/u there this month.  She was on bisphosphonate prior.  She was advised to c/t Calcium and Vitamin D daily.   Tdap vaccine counseling today, VIS given, vaccine given.  She is UTD on pneumococcal vaccine.   Shingles vaccine recommended, counseled, script for vaccine give to take to pharmacy.   Advises she check insurance coverage prior.  Hemorrhoids - reviewed consult from  December with general surgery.   She has implemented fiber changes in diet, and declined surgery at that time.  No worse symptoms from her report at this time.    GERD - on Dexilant, controlled.   Follow-up pending labs.

## 2011-10-17 LAB — COMPREHENSIVE METABOLIC PANEL
ALT: 17 U/L (ref 0–35)
Albumin: 4.6 g/dL (ref 3.5–5.2)
Alkaline Phosphatase: 72 U/L (ref 39–117)
BUN: 10 mg/dL (ref 6–23)
CO2: 27 mEq/L (ref 19–32)
Calcium: 9.8 mg/dL (ref 8.4–10.5)
Chloride: 106 mEq/L (ref 96–112)
Creat: 0.96 mg/dL (ref 0.50–1.10)
Glucose, Bld: 91 mg/dL (ref 70–99)
Potassium: 4.3 mEq/L (ref 3.5–5.3)
Sodium: 141 mEq/L (ref 135–145)
Total Bilirubin: 0.6 mg/dL (ref 0.3–1.2)
Total Protein: 6.8 g/dL (ref 6.0–8.3)

## 2011-10-17 LAB — LIPID PANEL
Cholesterol: 191 mg/dL (ref 0–200)
HDL: 61 mg/dL (ref >39)
LDL Cholesterol: 104 mg/dL — ABNORMAL HIGH (ref 0–99)
Total CHOL/HDL Ratio: 3.1 Ratio
Triglycerides: 130 mg/dL (ref <150)
VLDL: 26 mg/dL (ref 0–40)

## 2011-10-19 ENCOUNTER — Encounter: Payer: BC Managed Care – PPO | Admitting: Family Medicine

## 2011-11-30 ENCOUNTER — Ambulatory Visit (INDEPENDENT_AMBULATORY_CARE_PROVIDER_SITE_OTHER): Payer: BC Managed Care – PPO | Admitting: Medical

## 2011-11-30 ENCOUNTER — Encounter: Payer: Self-pay | Admitting: Medical

## 2011-11-30 VITALS — BP 118/78 | HR 68 | Temp 98.3°F | Resp 18 | Wt 187.0 lb

## 2011-11-30 DIAGNOSIS — T148XXA Other injury of unspecified body region, initial encounter: Secondary | ICD-10-CM

## 2011-11-30 DIAGNOSIS — W57XXXA Bitten or stung by nonvenomous insect and other nonvenomous arthropods, initial encounter: Secondary | ICD-10-CM

## 2011-11-30 DIAGNOSIS — T148 Other injury of unspecified body region: Secondary | ICD-10-CM

## 2011-11-30 NOTE — Progress Notes (Signed)
Subjective: Here with c/o recent tick bites.   She had been feeling "moles" on her back, but when she went to see her dermatologist there were 3 bumps on her skin suggesting bites, and she had a tick attached to the back under the bra.  She notes around the same time her husband pulled a tick off the middle of her back, and she found another in the floor which she thinks was attached to her right hip.  Dermatologist put her on Doxycycline x 30 days.  They told her to come here to be checked for lyme disease since she was concerned about lyme disease in her blood stream.  She denies any other symptoms.  No fever, rash, fatigue, joint pains.  Dermatologist will see her back in 3wk to potentially lance the back lesion if it doesn't heal.    Past Medical History  Diagnosis Date  . Allergy   . Arthritis   . Hypertension   . Hyperlipidemia   . Osteopenia   . ASHD (arteriosclerotic heart disease)     [facility] f/u yearly  . Eczema   . GERD (gastroesophageal reflux disease)   . Hemorrhoid     internal and external, general surgery consult 04/2011  . Anemia   . Blood in stool     GI consult 2012, EGD and colonoscopy  . Clotting disorder   . History of echocardiogram 02/17/2008    normal LV structure and function w/ EF >55%, trace MR, TR and PI  . History of nuclear stress test 02/2008    no significant ischemia, low risk scan, post stress EF 68%  . H/O bone density study 07/09/2006    osteopenia; [facility])   Objective: Gen: wd, wn, nad Skin: 3 brown papular lesions 1 mid back under bra line, 1 left back under bra line, and 1 right hip, all similar, no induration, fluctuance or warmth Heart: RRR, normal s1, s2, no murmur  Assessment: Encounter Diagnosis  Name Primary?  . Tick bite Yes    Plan: advised she c/t the Doxycycline BID x 3 weeks today.  F/u with dermatology as planned . She is asymptomatic and on therapy.  Discussed symptoms of Lyme disease, her  concerns.  We will hold off on getting lab for lyme as it wouldn't change there therapy.

## 2012-03-25 ENCOUNTER — Telehealth: Payer: Self-pay | Admitting: Family Medicine

## 2012-03-26 ENCOUNTER — Telehealth: Payer: Self-pay | Admitting: Medical

## 2012-03-26 NOTE — Telephone Encounter (Signed)
DO YOU WANT TO SWITCH TO NEXIUM OR OMEPRAZOLE?

## 2012-03-27 ENCOUNTER — Telehealth: Payer: Self-pay | Admitting: Medical

## 2012-03-27 NOTE — Telephone Encounter (Signed)
Call pt.  i can't recall which she used prior that worked. Let her know insurance is declining to pay for it, making her choose either nexium or omeprazole.  See which she wants to try

## 2012-03-28 ENCOUNTER — Telehealth: Payer: Self-pay | Admitting: Medical

## 2012-03-28 NOTE — Telephone Encounter (Signed)
DEXILANT HAS WORKED WELL FOR YEAR.  PT WANTS [place] TO DO APPEAL.  SAMPLES DEXILANT GIVEN TO HOLD PT OVER

## 2012-03-31 NOTE — Telephone Encounter (Signed)
SHANE CAN YOU DO APPEAL LETTER FOR PT'S COVERAGE OF DEXILANT

## 2012-03-31 NOTE — Telephone Encounter (Signed)
pls work on letter and i'll sign

## 2012-04-07 NOTE — Telephone Encounter (Signed)
APPEAL LETTER DONE

## 2012-04-14 ENCOUNTER — Telehealth: Payer: Self-pay | Admitting: Medical

## 2012-04-14 NOTE — Telephone Encounter (Signed)
LM

## 2012-05-14 NOTE — Telephone Encounter (Signed)
tsd  °

## 2012-05-18 ENCOUNTER — Other Ambulatory Visit: Payer: Self-pay | Admitting: Family Medicine

## 2012-05-19 NOTE — Telephone Encounter (Signed)
Is this ok?

## 2012-05-23 ENCOUNTER — Encounter: Payer: Self-pay | Admitting: Family Medicine

## 2012-05-23 ENCOUNTER — Ambulatory Visit (INDEPENDENT_AMBULATORY_CARE_PROVIDER_SITE_OTHER): Payer: BC Managed Care – PPO | Admitting: Family Medicine

## 2012-05-23 VITALS — BP 124/84 | HR 66 | Wt 193.0 lb

## 2012-05-23 DIAGNOSIS — M6530 Trigger finger, unspecified finger: Secondary | ICD-10-CM

## 2012-05-23 DIAGNOSIS — M65341 Trigger finger, right ring finger: Secondary | ICD-10-CM

## 2012-05-23 NOTE — Progress Notes (Signed)
  Subjective:    Patient ID: [name] [name], female    DOB: 1951-04-27, 62 y.o.   MRN: [id number]  HPI She complains of difficulty with pain and triggering motion of the right ring finger. She noted this after she used a new topical preparation. She noted that she had the pain next morning and stiffness.   Review of Systems     Objective:   Physical Exam Full motion of the fingers with no swelling noted. Triggering is noted of the ring finger with extension.       Assessment & Plan:   1. Trigger ring finger of right hand    recommend heat and anti-inflammatory. If she continues to have difficulty, I will refer her to orthopedics for possible injection.

## 2012-05-23 NOTE — Patient Instructions (Signed)
Heat for 20 minutes 3 times a day. 2 Aleve twice a day for the next 2 weeks. If you keep having difficulty over for you to an hand surgeon for an injection

## 2012-06-01 ENCOUNTER — Other Ambulatory Visit: Payer: Self-pay | Admitting: Medical

## 2012-06-20 ENCOUNTER — Other Ambulatory Visit: Payer: Self-pay | Admitting: Medical

## 2012-06-20 ENCOUNTER — Ambulatory Visit (INDEPENDENT_AMBULATORY_CARE_PROVIDER_SITE_OTHER): Payer: BC Managed Care – PPO | Admitting: Family Medicine

## 2012-06-20 VITALS — BP 124/80 | HR 67 | Temp 98.2°F | Wt 185.0 lb

## 2012-06-20 DIAGNOSIS — J069 Acute upper respiratory infection, unspecified: Secondary | ICD-10-CM

## 2012-06-20 NOTE — Progress Notes (Signed)
  Subjective:    Patient ID: [name] [name], female    DOB: 08/13/1950, 62 y.o.   MRN: [id number]  HPI She has a 4 day history this started with rhinorrhea, sore throat, fever and chills, cough. Last night she developed left earache. She also complains of fatigue. She has tried NyQuil and Dayquil   Review of Systems     Objective:   Physical Exam alert and in no distress. Tympanic membranes and canals are normal. Throat is clear. Tonsils are normal. Neck is supple without adenopathy or thyromegaly. Cardiac exam shows a regular sinus rhythm without murmurs or gallops. Lungs are clear to auscultation.        Assessment & Plan:  Acute upper respiratory infections of unspecified site supportive care. Encouraged her to not visit her husband since he is now postoperative and she is coughing with a slight fever.

## 2012-06-20 NOTE — Patient Instructions (Addendum)
The best way to treat fever and chills is to use  Aleve regularly which would be 2 pills 2 or 3 times per day.deep using the NyQuil for nighttime relief. Gargle with whatever works best for your sore throat.if you're running a fever and still coughing stay away from the hospital

## 2012-07-29 ENCOUNTER — Telehealth: Payer: Self-pay | Admitting: Family Medicine

## 2012-07-29 ENCOUNTER — Other Ambulatory Visit: Payer: Self-pay | Admitting: Family Medicine

## 2012-07-29 MED ORDER — FELODIPINE ER 10 MG PO TB24: ORAL_TABLET | ORAL | Status: DC

## 2012-07-29 NOTE — Telephone Encounter (Signed)
PT NEEDS REFILL ON FELODIPINE SENT TO [facility] [address]

## 2012-07-29 NOTE — Telephone Encounter (Signed)
SENT MED IN 

## 2012-08-25 ENCOUNTER — Other Ambulatory Visit: Payer: Self-pay | Admitting: Medical

## 2012-08-27 ENCOUNTER — Telehealth: Payer: Self-pay | Admitting: Internal Medicine

## 2012-08-27 ENCOUNTER — Other Ambulatory Visit: Payer: Self-pay | Admitting: Medical

## 2012-08-27 MED ORDER — FELODIPINE ER 10 MG PO TB24: ORAL_TABLET | ORAL | Status: DC

## 2012-08-27 NOTE — Telephone Encounter (Signed)
Refill request for felodipine ER 10mg  #30 to [facility] [address]

## 2012-08-28 NOTE — Telephone Encounter (Signed)
done

## 2012-09-01 ENCOUNTER — Ambulatory Visit (INDEPENDENT_AMBULATORY_CARE_PROVIDER_SITE_OTHER): Payer: BC Managed Care – PPO | Admitting: Family Medicine

## 2012-09-01 ENCOUNTER — Encounter: Payer: Self-pay | Admitting: Family Medicine

## 2012-09-01 VITALS — BP 130/86 | Wt 193.0 lb

## 2012-09-01 DIAGNOSIS — M79609 Pain in unspecified limb: Secondary | ICD-10-CM

## 2012-09-01 DIAGNOSIS — M79672 Pain in left foot: Secondary | ICD-10-CM

## 2012-09-01 NOTE — Progress Notes (Signed)
  Subjective:    Patient ID: [name] [name], female    DOB: 05-09-1950, 62 y.o.   MRN: [id number]  HPI 10 days ago she noted the onset of left foot pain on the instep. No history of injury to that area. Did note that she is feeling some discomfort in a couple of other joints but no swelling, fever or chills.   Review of Systems     Objective:   Physical Exam  exam of the left foot showsno tenderness to palpation over the metatarsal or tarsal area. Good motion of the ankle. Heel is nontender compression test negative. No pain in the retrocalcaneal area.       Assessment & Plan:  Left foot pain recommend conservative care with heat, anti-inflammatory and heart support. Explained that I saw nothing significant however if her symptoms change, she is to let me know.

## 2012-09-01 NOTE — Patient Instructions (Signed)
Use heat for 20 minutes 3 times per day. 2 Aleve twice per day. Comfortable flat shoes with arch support. Do this for the next week or 2 and if still having difficulty come back for x-rays and further testing

## 2012-09-09 ENCOUNTER — Telehealth: Payer: Self-pay | Admitting: Family Medicine

## 2012-09-09 ENCOUNTER — Ambulatory Visit
Admission: RE | Admit: 2012-09-09 | Discharge: 2012-09-09 | Disposition: A | Discharge status: Home / Self Care | Payer: BC Managed Care – PPO | Source: Ambulatory Visit | Attending: Family Medicine | Admitting: Family Medicine

## 2012-09-09 ENCOUNTER — Other Ambulatory Visit: Payer: Self-pay

## 2012-09-09 NOTE — Telephone Encounter (Signed)
He meant to send this to you

## 2012-09-09 NOTE — Telephone Encounter (Signed)
Send her off to get a foot x-ray and then followup with me after that with an appointment

## 2012-09-09 NOTE — Telephone Encounter (Signed)
I HAVE PUT LEFT XRAY IN SYSTEM CALLED PT TO INFORM HER OF WHERE TO GO TO HAVE XRAY AND TO FOLLOW UP WITH AN APPT AFTER SHE HAS XRAY DONE LEFT # [phone]  ON HOME #

## 2012-09-09 NOTE — Progress Notes (Signed)
Quick Note:  Left message for pt to call me back ______

## 2012-09-10 NOTE — Progress Notes (Signed)
Quick Note:  PT WAS INFORMED OF APPT WITH [facility] [phone] Sep 22 2012 AT 9:30 PT VERBALIZED UNDERSTANDING ______

## 2012-09-30 ENCOUNTER — Encounter: Payer: Self-pay | Admitting: Internal Medicine

## 2012-10-15 ENCOUNTER — Ambulatory Visit (INDEPENDENT_AMBULATORY_CARE_PROVIDER_SITE_OTHER): Payer: BC Managed Care – PPO | Admitting: Family Medicine

## 2012-10-15 ENCOUNTER — Encounter: Payer: Self-pay | Admitting: Family Medicine

## 2012-10-15 VITALS — BP 142/90 | HR 60 | Ht 65.0 in | Wt 194.0 lb

## 2012-10-15 DIAGNOSIS — M8580 Other specified disorders of bone density and structure, unspecified site: Secondary | ICD-10-CM

## 2012-10-15 DIAGNOSIS — I10 Essential (primary) hypertension: Secondary | ICD-10-CM

## 2012-10-15 DIAGNOSIS — E785 Hyperlipidemia, unspecified: Secondary | ICD-10-CM

## 2012-10-15 DIAGNOSIS — Z79899 Other long term (current) drug therapy: Secondary | ICD-10-CM

## 2012-10-15 DIAGNOSIS — Z0000 Encounter for general adult medical examination without abnormal findings: Secondary | ICD-10-CM

## 2012-10-15 DIAGNOSIS — K219 Gastro-esophageal reflux disease without esophagitis: Secondary | ICD-10-CM

## 2012-10-15 DIAGNOSIS — M899 Disorder of bone, unspecified: Secondary | ICD-10-CM

## 2012-10-15 LAB — POCT URINALYSIS DIPSTICK
Ketones, UA: NEGATIVE
Leukocytes, UA: NEGATIVE
Nitrite, UA: NEGATIVE
Protein, UA: NEGATIVE
Urobilinogen, UA: NEGATIVE
pH, UA: 6

## 2012-10-15 LAB — CBC WITH DIFFERENTIAL/PLATELET
Basophils Absolute: 0.1 10*3/uL (ref 0.0–0.1)
Basophils Relative: 2 % — ABNORMAL HIGH (ref 0–1)
Eosinophils Absolute: 0.3 10*3/uL (ref 0.0–0.7)
MCH: 29 pg (ref 26.0–34.0)
MCHC: 32.4 g/dL (ref 30.0–36.0)
Monocytes Relative: 10 % (ref 3–12)
Neutro Abs: 2 10*3/uL (ref 1.7–7.7)
Neutrophils Relative %: 41 % — ABNORMAL LOW (ref 43–77)
Platelets: 265 10*3/uL (ref 150–400)
RDW: 14.8 % (ref 11.5–15.5)

## 2012-10-15 MED ORDER — ATORVASTATIN CALCIUM 40 MG PO TABS: ORAL_TABLET | ORAL | Status: DC

## 2012-10-15 MED ORDER — BISOPROLOL-HYDROCHLOROTHIAZIDE 5-6.25 MG PO TABS: ORAL_TABLET | ORAL | Status: DC

## 2012-10-15 MED ORDER — FELODIPINE ER 10 MG PO TB24: ORAL_TABLET | ORAL | Status: DC

## 2012-10-15 MED ORDER — DEXLANSOPRAZOLE 60 MG PO CPDR: DELAYED_RELEASE_CAPSULE | ORAL | Status: DC

## 2012-10-15 NOTE — Progress Notes (Signed)
Subjective:    Patient ID: [name] [name], female    DOB: June 07, 1950, 62 y.o.   MRN: [id number]  HPI She is here for complete examination. She continues on her blood pressure as well as cholesterol medicine and is doing well on them. Is working well for her reflux symptoms. She also had a DEXA scan which showed osteopenia however her gynecologist started her on Fosamax. She will need another DEXA scan in one year. Her immunizations and health maintenance were reviewed and are up to date. Social and family history were reviewed and are unchanged. She is going to retire within the next year or 2 and does have a game plan in mind for that.   Review of Systems  Constitutional: Negative.   HENT: Negative.   Eyes: Negative.   Respiratory: Negative.   Cardiovascular: Negative.   Gastrointestinal: Negative.   Endocrine: Negative.   Genitourinary: Negative.   Allergic/Immunologic: Negative.   Neurological: Negative.   Hematological: Negative.   Psychiatric/Behavioral: Negative.        Objective:   Physical Exam BP 142/90  Pulse 60  Ht 5\' 5"  (1.651 m)  Wt 194 lb (87.998 kg)  BMI 32.28 kg/m2  General Appearance:    Alert, cooperative, no distress, appears stated age  Head:    Normocephalic, without obvious abnormality, atraumatic  Eyes:    PERRL, conjunctiva/corneas clear, EOM's intact, fundi    benign  Ears:    Normal TM's and external ear canals  Nose:   Nares normal, mucosa normal, no drainage or sinus   tenderness  Throat:   Lips, mucosa, and tongue normal; teeth and gums normal  Neck:   Supple, no lymphadenopathy;  thyroid:  no   enlargement/tenderness/nodules; no carotid   bruit or JVD  Back:    Spine nontender, no curvature, ROM normal, no CVA     tenderness  Lungs:     Clear to auscultation bilaterally without wheezes, rales or     ronchi; respirations unlabored  Chest Wall:    No tenderness or deformity   Heart:    Regular rate and rhythm, S1 and S2 normal, no murmur, rub  or gallop  Breast Exam:    Deferred to GYN  Abdomen:     Soft, non-tender, nondistended, normoactive bowel sounds,    no masses, no hepatosplenomegaly  Genitalia:    Deferred to GYN     Extremities:   No clubbing, cyanosis or edema  Pulses:   2+ and symmetric all extremities  Skin:   Skin color, texture, turgor normal, no rashes or lesions  Lymph nodes:   Cervical, supraclavicular, and axillary nodes normal  Neurologic:   CNII-XII intact, normal strength, sensation and gait; reflexes 2+ and symmetric throughout          Psych:   Normal mood, affect, hygiene and grooming.          Assessment & Plan:  Routine general medical examination at a health care facility - Plan: CBC with Differential, Comprehensive metabolic panel, Lipid panel  Essential hypertension, benign - Plan: POCT urinalysis dipstick, bisoprolol-hydrochlorothiazide (ZIAC) 5-6.25 MG per tablet, felodipine (PLENDIL) 10 MG 24 hr tablet  Hyperlipidemia - Plan: atorvastatin (LIPITOR) 40 MG tablet  GERD (gastroesophageal reflux disease) - Plan: dexlansoprazole (DEXILANT) 60 MG capsule  Encounter for long-term (current) use of other medications - Plan: CBC with Differential, Comprehensive metabolic panel, Lipid panel  Osteopenia I encouraged her to become more physically active and to try and calm down 2 dress sizes.  Discussed the fact that could potentially help with lowering her need for blood pressure as well as cholesterol type medications.

## 2012-10-15 NOTE — Patient Instructions (Signed)
Move your body 150 minutes a week of something

## 2012-10-16 LAB — COMPREHENSIVE METABOLIC PANEL
AST: 19 U/L (ref 0–37)
Alkaline Phosphatase: 55 U/L (ref 39–117)
Glucose, Bld: 81 mg/dL (ref 70–99)
Potassium: 4.2 mEq/L (ref 3.5–5.3)
Sodium: 144 mEq/L (ref 135–145)
Total Bilirubin: 0.6 mg/dL (ref 0.3–1.2)
Total Protein: 6.7 g/dL (ref 6.0–8.3)

## 2012-10-16 LAB — LIPID PANEL
LDL Cholesterol: 74 mg/dL (ref 0–99)
Triglycerides: 103 mg/dL (ref <150)
VLDL: 21 mg/dL (ref 0–40)

## 2012-10-16 NOTE — Progress Notes (Signed)
Quick Note:  Called pt home # left word for word message Labs look good. Continue present dosing regimen. ______

## 2012-10-17 ENCOUNTER — Encounter: Payer: Self-pay | Admitting: Family Medicine

## 2012-11-12 ENCOUNTER — Other Ambulatory Visit: Payer: Self-pay | Admitting: Obstetrics and Gynecology

## 2012-11-12 DIAGNOSIS — N63 Unspecified lump in unspecified breast: Secondary | ICD-10-CM

## 2012-11-24 ENCOUNTER — Other Ambulatory Visit: Payer: Self-pay | Admitting: Obstetrics and Gynecology

## 2012-11-24 ENCOUNTER — Ambulatory Visit
Admission: RE | Admit: 2012-11-24 | Discharge: 2012-11-24 | Disposition: A | Discharge status: Home / Self Care | Payer: BC Managed Care – PPO | Source: Ambulatory Visit | Attending: Obstetrics and Gynecology | Admitting: Obstetrics and Gynecology

## 2012-11-24 DIAGNOSIS — N63 Unspecified lump in unspecified breast: Secondary | ICD-10-CM

## 2013-05-08 ENCOUNTER — Telehealth: Payer: Self-pay | Admitting: Family Medicine

## 2013-05-12 ENCOUNTER — Telehealth: Payer: Self-pay | Admitting: Family Medicine

## 2013-05-12 NOTE — Telephone Encounter (Signed)
Pt called needs you to call insurance again concerning [place] for Dexilant  [phone] and they will explain the problem, they state it was billed incorrectly The first case was deleted and per pt when you call them and give them the information they need, it can be approved

## 2013-05-21 ENCOUNTER — Encounter: Payer: Self-pay | Admitting: Family Medicine

## 2013-06-01 NOTE — Telephone Encounter (Signed)
lm

## 2013-08-12 ENCOUNTER — Telehealth: Payer: Self-pay | Admitting: Family Medicine

## 2013-08-12 MED ORDER — FEXOFENADINE HCL 180 MG PO TABS: 180.0000 mg | ORAL_TABLET | Freq: Every day | ORAL | Status: DC

## 2013-08-12 NOTE — Telephone Encounter (Signed)
Pt wants a refill on her Allegra, I called pt to advise her that she can get Allegra OTC and she states she needs a Rx for it so she can use her Flex spending card.She is not sure what strength she was on but states you Rxd it for her last year. Please advise

## 2013-08-28 ENCOUNTER — Telehealth: Payer: Self-pay | Admitting: Family Medicine

## 2013-08-28 NOTE — Telephone Encounter (Signed)
Called pharmacy & they states ins is paying co pay is $64.  Pt's discount card expired.  I called and activated a new discount card & called back pharmacy & had them rerun the Dexilant, it is now $20 a month.  Called & informed pt

## 2013-09-21 IMAGING — MG MM DIGITAL DIAGNOSTIC LIMITED*R*
1 series · 1 of 1 positions shown · non-contrast
Comparison: Priors including 11/11/2012

CLINICAL DATA: Patient presents for palpable right breast lump.

DIGITAL DIAGNOSTIC RIGHT MAMMOGRAM  AND RIGHT BREAST ULTRASOUND:

[R TAN]
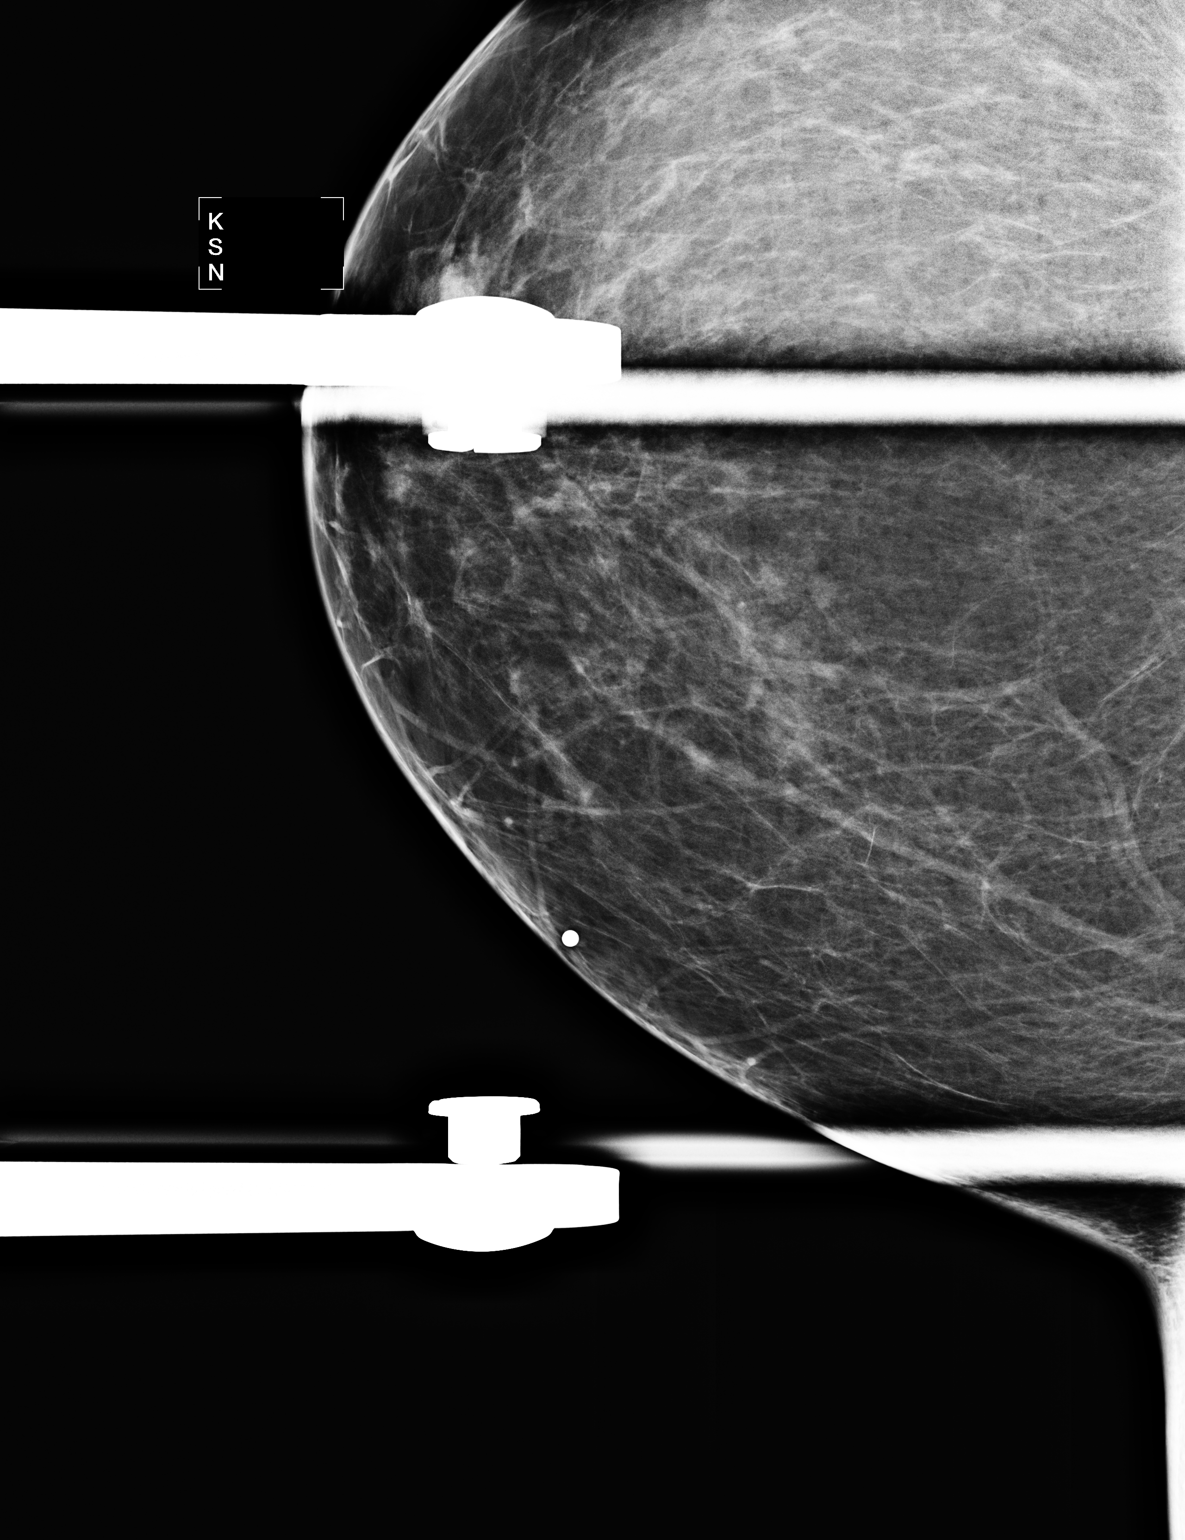

[1 of 1 positions shown; findings below may reference images not displayed]

FINDINGS: ACR Breast Density Category b:  There are scattered areas of
fibroglandular density.

No concerning masses, architectural distortion or calcifications
underlying the palpable marker within the right breast.

On physical exam, I palpate no discrete mass within the area of
palpable abnormality along the medial right breast.

Ultrasound is performed, showing no concerning masses within the
upper inner quadrant of the right breast at the site of palpable
abnormality.
IMPRESSION: No evidence for malignancy within the site of palpable abnormality
on mammogram or ultrasound.  Recommend continued clinical follow-
up.

RECOMMENDATION:
Screening mammogram in one year. (Code:5E-[id number])

I have discussed the findings and recommendations with the patient.
Results were also provided in writing at the conclusion of the
visit.  If applicable, a reminder letter will be sent to the
patient regarding the next appointment.

BI-RADS CATEGORY 1:  Negative.

## 2013-10-15 ENCOUNTER — Other Ambulatory Visit: Payer: Self-pay | Admitting: Family Medicine

## 2013-10-16 ENCOUNTER — Ambulatory Visit (INDEPENDENT_AMBULATORY_CARE_PROVIDER_SITE_OTHER): Payer: BC Managed Care – PPO | Admitting: Family Medicine

## 2013-10-16 ENCOUNTER — Encounter: Payer: Self-pay | Admitting: Family Medicine

## 2013-10-16 VITALS — BP 130/90 | HR 68 | Ht 65.0 in | Wt 189.0 lb

## 2013-10-16 DIAGNOSIS — M949 Disorder of cartilage, unspecified: Secondary | ICD-10-CM

## 2013-10-16 DIAGNOSIS — J309 Allergic rhinitis, unspecified: Secondary | ICD-10-CM

## 2013-10-16 DIAGNOSIS — K219 Gastro-esophageal reflux disease without esophagitis: Secondary | ICD-10-CM

## 2013-10-16 DIAGNOSIS — M8580 Other specified disorders of bone density and structure, unspecified site: Secondary | ICD-10-CM

## 2013-10-16 DIAGNOSIS — Z0000 Encounter for general adult medical examination without abnormal findings: Secondary | ICD-10-CM

## 2013-10-16 DIAGNOSIS — E785 Hyperlipidemia, unspecified: Secondary | ICD-10-CM

## 2013-10-16 DIAGNOSIS — I10 Essential (primary) hypertension: Secondary | ICD-10-CM

## 2013-10-16 DIAGNOSIS — M899 Disorder of bone, unspecified: Secondary | ICD-10-CM

## 2013-10-16 LAB — POCT URINALYSIS DIPSTICK
Bilirubin, UA: NEGATIVE
Blood, UA: NEGATIVE
GLUCOSE UA: NEGATIVE
KETONES UA: NEGATIVE
Leukocytes, UA: NEGATIVE
NITRITE UA: NEGATIVE
Protein, UA: NEGATIVE
Spec Grav, UA: 1.02
Urobilinogen, UA: NEGATIVE
pH, UA: 5

## 2013-10-16 LAB — CBC WITH DIFFERENTIAL/PLATELET
Basophils Absolute: 0 10*3/uL (ref 0.0–0.1)
Basophils Relative: 1 % (ref 0–1)
Eosinophils Absolute: 0.3 10*3/uL (ref 0.0–0.7)
Eosinophils Relative: 7 % — ABNORMAL HIGH (ref 0–5)
HEMATOCRIT: 37.6 % (ref 36.0–46.0)
HEMOGLOBIN: 12.7 g/dL (ref 12.0–15.0)
LYMPHS ABS: 1.8 10*3/uL (ref 0.7–4.0)
Lymphocytes Relative: 40 % (ref 12–46)
MCH: 29.2 pg (ref 26.0–34.0)
MCHC: 33.8 g/dL (ref 30.0–36.0)
MCV: 86.4 fL (ref 78.0–100.0)
MONO ABS: 0.4 10*3/uL (ref 0.1–1.0)
MONOS PCT: 8 % (ref 3–12)
NEUTROS ABS: 2 10*3/uL (ref 1.7–7.7)
Neutrophils Relative %: 44 % (ref 43–77)
Platelets: 256 10*3/uL (ref 150–400)
RBC: 4.35 MIL/uL (ref 3.87–5.11)
RDW: 15.2 % (ref 11.5–15.5)
WBC: 4.5 10*3/uL (ref 4.0–10.5)

## 2013-10-16 MED ORDER — FELODIPINE ER 10 MG PO TB24: ORAL_TABLET | ORAL | Status: DC

## 2013-10-16 MED ORDER — BISOPROLOL-HYDROCHLOROTHIAZIDE 5-6.25 MG PO TABS: ORAL_TABLET | ORAL | Status: DC

## 2013-10-16 MED ORDER — ALENDRONATE SODIUM 70 MG PO TABS: 70.0000 mg | ORAL_TABLET | ORAL | Status: DC

## 2013-10-16 MED ORDER — DEXLANSOPRAZOLE 60 MG PO CPDR: DELAYED_RELEASE_CAPSULE | ORAL | Status: DC

## 2013-10-16 NOTE — Progress Notes (Signed)
   Subjective:    Patient ID: [name] [name], female    DOB: Nov 19, 1950, 63 y.o.   MRN: [id number]  HPI She is here for complete examination. She continues on her blood pressure medications and is having no difficulty with this. She also takes Fosamax regularly. Reflux is under good control. She does have underlying allergies but again having no difficulty. He continues on Lipitor. She has not very physically active. Smoking and drinking as well as family and social history were reviewed. She is getting ready to retire. She will see her gynecologist and have routine Pap, pelvic and get a DEXA scan done. She has no other concerns or complaints.   Review of Systems  All other systems reviewed and are negative.      Objective:   Physical Exam BP 130/90  Pulse 68  Ht 5\' 5"  (1.651 m)  Wt 189 lb (85.73 kg)  BMI 31.45 kg/m2  General Appearance:    Alert, cooperative, no distress, appears stated age  Head:    Normocephalic, without obvious abnormality, atraumatic  Eyes:    PERRL, conjunctiva/corneas clear, EOM's intact, fundi    benign  Ears:    Normal TM's and external ear canals  Nose:   Nares normal, mucosa normal, no drainage or sinus   tenderness  Throat:   Lips, mucosa, and tongue normal; teeth andEncouraged her to continue to take good care of herself but also increase her physical activity. Also discussed dietary modification especially in regard to decreasing carbohydrates. gums normal  Neck:   Supple, no lymphadenopathy;  thyroid:  no   enlargement/tenderness/nodules; no carotid   bruit or JVD  Back:    Spine nontender, no curvature, ROM normal, no CVA     tenderness  Lungs:     Clear to auscultation bilaterally without wheezes, rales or     ronchi; respirations unlabored  Chest Wall:    No tenderness or deformity   Heart:    Regular rate and rhythm, S1 and S2 normal, no murmur, rub   or gallop  Breast Exam:    Deferred to GYN  Abdomen:     Soft, non-tender, nondistended,  normoactive bowel sounds,    no masses, no hepatosplenomegaly  Genitalia:    Deferred to GYN     Extremities:   No clubbing, cyanosis or edema  Pulses:   2+ and symmetric all extremities  Skin:   Skin color, texture, turgor normal, no rashes or lesions  Lymph nodes:   Cervical, supraclavicular, and axillary nodes normal  Neurologic:   CNII-XII intact, normal strength, sensation and gait; reflexes 2+ and symmetric throughout          Psych:   Normal mood, affect, hygiene and grooming.          Assessment & Plan:  Routine general medical examination at a health care facility - Plan: Urinalysis Dipstick, CBC with Differential, Comprehensive metabolic panel, Lipid panel  Essential hypertension, benign - Plan: CBC with Differential, Comprehensive metabolic panel, bisoprolol-hydrochlorothiazide (ZIAC) 5-6.25 MG per tablet, felodipine (PLENDIL) 10 MG 24 hr tablet  Hyperlipidemia - Plan: Lipid panel  Osteopenia - Plan: alendronate (FOSAMAX) 70 MG tablet  GERD (gastroesophageal reflux disease) - Plan: dexlansoprazole (DEXILANT) 60 MG capsule  Allergic rhinitis

## 2013-10-16 NOTE — Patient Instructions (Addendum)
Cut back on "white food" Do something physically every day for 20 minutes Get down to a size 10 dress and stay there

## 2013-10-17 LAB — COMPREHENSIVE METABOLIC PANEL
ALT: 15 U/L (ref 0–35)
AST: 19 U/L (ref 0–37)
Albumin: 4.7 g/dL (ref 3.5–5.2)
Alkaline Phosphatase: 59 U/L (ref 39–117)
BUN: 12 mg/dL (ref 6–23)
CALCIUM: 9.6 mg/dL (ref 8.4–10.5)
CHLORIDE: 105 meq/L (ref 96–112)
CO2: 27 meq/L (ref 19–32)
Creat: 0.92 mg/dL (ref 0.50–1.10)
GLUCOSE: 90 mg/dL (ref 70–99)
POTASSIUM: 4.1 meq/L (ref 3.5–5.3)
SODIUM: 141 meq/L (ref 135–145)
TOTAL PROTEIN: 6.9 g/dL (ref 6.0–8.3)
Total Bilirubin: 0.7 mg/dL (ref 0.2–1.2)

## 2013-10-17 LAB — LIPID PANEL
CHOLESTEROL: 204 mg/dL — AB (ref 0–200)
HDL: 70 mg/dL (ref >39)
LDL CALC: 114 mg/dL — AB (ref 0–99)
TRIGLYCERIDES: 99 mg/dL (ref <150)
Total CHOL/HDL Ratio: 2.9 Ratio
VLDL: 20 mg/dL (ref 0–40)

## 2013-12-01 ENCOUNTER — Telehealth: Payer: Self-pay | Admitting: Family Medicine

## 2013-12-01 NOTE — Telephone Encounter (Signed)
Pt has P.A for Dexilant still good til 05/28/14.  Last month called pharmacy & gave discount card and Rx went from $64 down to $20. Pt was told it had expired.   Called & activated another discount card and called pharmacy and got it to go thru for $20. Pt informed

## 2014-04-16 ENCOUNTER — Encounter: Payer: Self-pay | Admitting: Cardiology

## 2014-04-16 ENCOUNTER — Telehealth (HOSPITAL_COMMUNITY): Payer: Self-pay

## 2014-04-16 ENCOUNTER — Ambulatory Visit (INDEPENDENT_AMBULATORY_CARE_PROVIDER_SITE_OTHER): Payer: BC Managed Care – PPO | Admitting: Cardiology

## 2014-04-16 VITALS — BP 140/84 | HR 62 | Ht 65.0 in | Wt 201.3 lb

## 2014-04-16 DIAGNOSIS — R0602 Shortness of breath: Secondary | ICD-10-CM | POA: Insufficient documentation

## 2014-04-16 DIAGNOSIS — I10 Essential (primary) hypertension: Secondary | ICD-10-CM

## 2014-04-16 DIAGNOSIS — R0600 Dyspnea, unspecified: Secondary | ICD-10-CM

## 2014-04-16 DIAGNOSIS — E785 Hyperlipidemia, unspecified: Secondary | ICD-10-CM

## 2014-04-16 NOTE — Progress Notes (Signed)
HPI The patient presents for evaluation of dyspnea. She had a history of nonobstructive coronary disease. She had a cardiac catheterization in 2006 with 40-50% proximal LAD stenosis and mild plaquing elsewhere. She was previously seen by another cardiology group.  She apparently had a negative stress perfusion study in 2009. She presents for evaluation of shortness of breath. She's noticed this recently and around Thanksgiving had some dyspnea while walking to the store. She's not had any resting shortness of breath and she denies any PND or orthopnea. She's had no palpitations, presyncope or syncope. She's had no weight gain or edema. She denies any chest pressure, neck or arm discomfort. She has gained some weight and has been under stress taking care of her husband who is ill. She just retired from her job a few days ago.  Allergies  Allergen Reactions  . Penicillins     Current Outpatient Prescriptions  Medication Sig Dispense Refill  . aspirin 81 MG tablet Take 81 mg by mouth daily.    Marland Kitchen. atorvastatin (LIPITOR) 40 MG tablet take 1 tablet by mouth once daily 90 tablet 1  . bisoprolol-hydrochlorothiazide (ZIAC) 5-6.25 MG per tablet take 1 tablet by mouth once daily 90 tablet 3  . dexlansoprazole (DEXILANT) 60 MG capsule take 1 capsule by mouth once daily 90 capsule 3  . Docusate Calcium (STOOL SOFTENER PO) Take 50 mg by mouth daily.    Marland Kitchen. docusate sodium (COLACE) 50 MG capsule Take by mouth 2 (two) times daily.    . felodipine (PLENDIL) 10 MG 24 hr tablet take 1 tablet by mouth once daily 90 tablet 3  . fexofenadine (ALLEGRA) 180 MG tablet Take 1 tablet (180 mg total) by mouth daily. (Patient taking differently: Take 180 mg by mouth as needed. ) 90 tablet 3  . FIBER PO Take 500 mg by mouth daily.    . IRON PO Take 1,000 mg by mouth.    . Multiple Vitamin (MULTIVITAMIN) capsule Take 1 capsule by mouth daily.      . Olopatadine HCl (PAZEO) 0.7 % SOLN Apply 1 drop to eye daily. Both Eyes    .  PREVIDENT 5000 ENAMEL PROTECT 1.1-5 % PSTE daily.  0  . vitamin E 400 UNIT capsule Take 400 Units by mouth daily.     No current facility-administered medications for this visit.    Past Medical History  Diagnosis Date  . Allergy   . Arthritis   . Hypertension   . Hyperlipidemia   . Osteopenia   . ASHD (arteriosclerotic heart disease)     [facility] f/u yearly  . Eczema   . GERD (gastroesophageal reflux disease)   . Hemorrhoid     internal and external, general surgery consult 04/2011  . Anemia   . Blood in stool     GI consult 2012, EGD and colonoscopy  . Clotting disorder   . History of echocardiogram 02/17/2008    normal LV structure and function w/ EF >55%, trace MR, TR and PI  . History of nuclear stress test 02/2008    no significant ischemia, low risk scan, post stress EF 68%  . H/O bone density study 07/09/2006    osteopenia; [facility]    Past Surgical History  Procedure Laterality Date  . Abdominal hysterectomy    . Colonoscopy  2012, 2008    Dr. Loreta AveMann  . Esophagogastroduodenoscopy  2012, 2008    Dr. Loreta AveMann    Family History  Problem Relation Age  of Onset  . Heart disease Mother   . Diabetes Mother   . Cancer Father     lung  . Diabetes Father   . Diabetes Sister   . Diabetes Brother   . Heart disease Brother     1 brother died MI age 62yo  . Stroke Neg Hx   . Hypertension Neg Hx   . Hyperlipidemia Neg Hx   . Cancer Other     maternal side,non first degree breast cancer    History   Social History  . Marital Status: Widowed    Spouse Name: N/A    Number of Children: N/A  . Years of Education: N/A   Occupational History  . [employer] benefits    Social History Main Topics  . Smoking status: Never Smoker   . Smokeless tobacco: Never Used  . Alcohol Use: No  . Drug Use: No  . Sexual Activity: Not Currently   Other Topics Concern  . Not on file   Social History Narrative   Married, exercise - walks 30min daily  5 days per week    ROS:   As stated in the HPI and negative for all other systems.  PHYSICAL EXAM BP 140/84 mmHg  Pulse 62  Ht 5\' 5"  (1.651 m)  Wt 201 lb 4.8 oz (91.309 kg)  BMI 33.50 kg/m2  GENERAL:  Well appearing HEENT:  Pupils equal round and reactive, fundi not visualized, oral mucosa unremarkable NECK:  No jugular venous distention, waveform within normal limits, carotid upstroke brisk and symmetric, no bruits, no thyromegaly LYMPHATICS:  No cervical, inguinal adenopathy LUNGS:  Clear to auscultation bilaterally BACK:  No CVA tenderness CHEST:  Unremarkable HEART:  PMI not displaced or sustained,S1 and S2 within normal limits, no S3, no S4, no clicks, no rubs, no murmurs ABD:  Flat, positive bowel sounds normal in frequency in pitch, no bruits, no rebound, no guarding, no midline pulsatile mass, no hepatomegaly, no splenomegaly EXT:  2 plus pulses throughout, no edema, no cyanosis no clubbing SKIN:  No rashes no nodules NEURO:  Cranial nerves II through XII grossly intact, motor grossly intact throughout [facility]:  Cognitively intact, oriented to person place and time   EKG:  Sinus rhythm, rate 60 to nonspecific inferior T-wave flattening, early transition in lead V2.  04/16/2014   ASSESSMENT AND PLAN  DYSPNEA:  Given this and her nonobstructive CAD she needs to be screened with a stress test.  However, she would not be able to walk on a treadmill.  Therefore she will have a [employer] Myoview.  I will also order a BNP.    OVERWEIGHT:  The patient understands the need to lose weight with diet and exercise. We have discussed specific strategies for this.  HTN:  Her BP is upper limits of normal.    She will continue on medications as needed.

## 2014-04-16 NOTE — Telephone Encounter (Signed)
Encounter complete. 

## 2014-04-16 NOTE — Patient Instructions (Signed)
Your physician recommends that you schedule a follow-up appointment in: as needed with Dr. Antoine PocheHochrein  We have ordered a stress test  We have ordered labs for you to get done

## 2014-04-17 LAB — BRAIN NATRIURETIC PEPTIDE: Brain Natriuretic Peptide: 37.2 pg/mL (ref 0.0–100.0)

## 2014-04-20 ENCOUNTER — Ambulatory Visit (HOSPITAL_COMMUNITY)
Admission: RE | Admit: 2014-04-20 | Discharge: 2014-04-20 | Disposition: A | Discharge status: Home / Self Care | Payer: BC Managed Care – PPO | Source: Ambulatory Visit | Attending: Cardiovascular Disease | Admitting: Cardiovascular Disease

## 2014-04-20 DIAGNOSIS — R0600 Dyspnea, unspecified: Secondary | ICD-10-CM | POA: Insufficient documentation

## 2014-04-20 DIAGNOSIS — J45909 Unspecified asthma, uncomplicated: Secondary | ICD-10-CM | POA: Insufficient documentation

## 2014-04-20 DIAGNOSIS — Z6833 Body mass index (BMI) 33.0-33.9, adult: Secondary | ICD-10-CM | POA: Diagnosis not present

## 2014-04-20 DIAGNOSIS — Z8249 Family history of ischemic heart disease and other diseases of the circulatory system: Secondary | ICD-10-CM | POA: Diagnosis not present

## 2014-04-20 DIAGNOSIS — I10 Essential (primary) hypertension: Secondary | ICD-10-CM | POA: Diagnosis not present

## 2014-04-20 DIAGNOSIS — R0602 Shortness of breath: Secondary | ICD-10-CM | POA: Diagnosis not present

## 2014-04-20 DIAGNOSIS — I2510 Atherosclerotic heart disease of native coronary artery without angina pectoris: Secondary | ICD-10-CM | POA: Diagnosis present

## 2014-04-20 MED ORDER — AMINOPHYLLINE 25 MG/ML IV SOLN
75.0000 mg | Freq: Once | INTRAVENOUS | Status: AC
  Administered 2014-04-20: 75 mg via INTRAVENOUS

## 2014-04-20 MED ORDER — TECHNETIUM TC 99M SESTAMIBI GENERIC - CARDIOLITE
32.0000 | Freq: Once | INTRAVENOUS | Status: AC | PRN
  Administered 2014-04-20: 32 via INTRAVENOUS

## 2014-04-20 MED ORDER — TECHNETIUM TC 99M SESTAMIBI GENERIC - CARDIOLITE
10.9000 | Freq: Once | INTRAVENOUS | Status: AC | PRN
  Administered 2014-04-20: 10.9 via INTRAVENOUS

## 2014-04-20 MED ORDER — REGADENOSON 0.4 MG/5ML IV SOLN
0.4000 mg | Freq: Once | INTRAVENOUS | Status: AC
  Administered 2014-04-20: 0.4 mg via INTRAVENOUS

## 2014-04-20 NOTE — Procedures (Addendum)
[facility] [address] [place] [id number] [phone]  Cardiology Nuclear Med Study  Herbie Baltimorester J Cunliffe is a 63 y.o. female     MRN : [id number]     DOB: 01/20/1951  Procedure Date: 04/20/2014  Nuclear Med Background Indication for Stress Test:  Follow up CAD History:  Asthma and Last NUC MPI on 02/2008-normal;EF=68% Cardiac Risk Factors: Family History - CAD and Hypertension  Symptoms:  DOE and SOB   Nuclear Pre-Procedure Caffeine/Decaff Intake:  7:00pm NPO After: 5:00am   IV Site: R Antecubital  IV 0.9% NS with Angio Cath:  22g  Chest Size (in):  n/a IV Started by: Berdie OgrenAmanda Wease, RN  Height: 5\' 5"  (1.651 m)  Cup Size: C  BMI:  Body mass index is 33.45 kg/(m^2). Weight:  201 lb (91.173 kg)   Tech Comments:  n/a    Nuclear Med Study 1 or 2 day study: 1 day  Stress Test Type:  Lexiscan  Order Authorizing Provider:  Rollene RotundaJames Hochrein, MD   Resting Radionuclide: Technetium 8039m Sestamibi  Resting Radionuclide Dose: 10.9 mCi   Stress Radionuclide:  Technetium 4339m Sestamibi  Stress Radionuclide Dose: 32.0 mCi           Stress Protocol Rest HR: 53 Stress HR: 64  Rest BP: 147/103 Stress BP: 129/80  Exercise Time (min): n/a METS: n/a   Predicted Max HR: 158 bpm % Max HR: 49.37 bpm Rate Pressure Product: [id number]  Dose of Adenosine (mg):  n/a Dose of Lexiscan: 0.4 mg  Dose of Atropine (mg): n/a Dose of Dobutamine: n/a mcg/kg/min (at max HR)  Stress Test Technologist: Esperanza Sheetserry-Marie Martin, CCT Nuclear Technologist: Gonzella LexPam Phillips, CNMT   Rest Procedure:  Myocardial perfusion imaging was performed at rest 45 minutes following the intravenous administration of Technetium 5139m Sestamibi. Stress Procedure:  The patient received IV Lexiscan 0.4 mg over 15-seconds.  Technetium 939m Sestamibi injected at 30-seconds.  Patient developed a Headache and 75 mg Aminophylline IV was administered.  There were no significant changes with Lexiscan.   Quantitative spect images were obtained after a 45 minute delay.  Transient Ischemic Dilatation (Normal <1.22):  1.19  QGS EDV:  95 ml QGS ESV:  41 ml LV Ejection Fraction: 57%       Rest ECG: NSR - Normal EKG  Stress ECG: No significant change from baseline ECG  QPS Raw Data Images:  Normal; no motion artifact; normal heart/lung ratio. Stress Images:  Normal homogeneous uptake in all areas of the myocardium. Rest Images:  Normal homogeneous uptake in all areas of the myocardium. Subtraction (SDS):  Normal  Impression Exercise Capacity:  Lexiscan with low level exercise. BP Response:  Normal blood pressure response. Clinical Symptoms:  No significant symptoms noted. ECG Impression:  No significant ST segment change suggestive of ischemia. Comparison with Prior Nuclear Study: No significant change from previous study  Overall Impression:  Normal stress nuclear study.  LV Wall Motion:  NL LV Function; NL Wall Motion   Runell GessBERRY,Nikea Settle J, MD  04/20/2014 1:36 PM

## 2014-05-03 ENCOUNTER — Other Ambulatory Visit: Payer: Self-pay | Admitting: Family Medicine

## 2014-06-15 ENCOUNTER — Telehealth: Payer: Self-pay | Admitting: Internal Medicine

## 2014-06-15 DIAGNOSIS — K219 Gastro-esophageal reflux disease without esophagitis: Secondary | ICD-10-CM

## 2014-06-15 NOTE — Telephone Encounter (Signed)
Pt needs a refill on her dexilant 60mg  sent to [facility] on [address]

## 2014-06-16 MED ORDER — DEXLANSOPRAZOLE 60 MG PO CPDR: DELAYED_RELEASE_CAPSULE | ORAL | Status: DC

## 2014-06-18 ENCOUNTER — Telehealth: Payer: Self-pay | Admitting: Family Medicine

## 2014-06-18 NOTE — Telephone Encounter (Signed)
Pt states she is out of her meds and asked for samples

## 2014-06-18 NOTE — Telephone Encounter (Signed)
[facility]

## 2014-06-21 NOTE — Telephone Encounter (Signed)
P.A. DEXILANT approved til 06/18/2015, pt informed

## 2014-10-18 ENCOUNTER — Encounter: Payer: Self-pay | Admitting: Family Medicine

## 2014-10-18 ENCOUNTER — Ambulatory Visit (INDEPENDENT_AMBULATORY_CARE_PROVIDER_SITE_OTHER): Payer: BC Managed Care – PPO | Admitting: Family Medicine

## 2014-10-18 VITALS — BP 150/90 | HR 60 | Ht 64.5 in | Wt 196.4 lb

## 2014-10-18 DIAGNOSIS — K219 Gastro-esophageal reflux disease without esophagitis: Secondary | ICD-10-CM | POA: Diagnosis not present

## 2014-10-18 DIAGNOSIS — E785 Hyperlipidemia, unspecified: Secondary | ICD-10-CM

## 2014-10-18 DIAGNOSIS — Z0000 Encounter for general adult medical examination without abnormal findings: Secondary | ICD-10-CM

## 2014-10-18 DIAGNOSIS — M1990 Unspecified osteoarthritis, unspecified site: Secondary | ICD-10-CM

## 2014-10-18 DIAGNOSIS — I10 Essential (primary) hypertension: Secondary | ICD-10-CM

## 2014-10-18 DIAGNOSIS — M8580 Other specified disorders of bone density and structure, unspecified site: Secondary | ICD-10-CM | POA: Diagnosis not present

## 2014-10-18 LAB — COMPREHENSIVE METABOLIC PANEL
ALBUMIN: 4.4 g/dL (ref 3.5–5.2)
ALT: 12 U/L (ref 0–35)
AST: 17 U/L (ref 0–37)
Alkaline Phosphatase: 59 U/L (ref 39–117)
BILIRUBIN TOTAL: 0.7 mg/dL (ref 0.2–1.2)
BUN: 15 mg/dL (ref 6–23)
CO2: 28 meq/L (ref 19–32)
Calcium: 10 mg/dL (ref 8.4–10.5)
Chloride: 104 mEq/L (ref 96–112)
Creat: 0.89 mg/dL (ref 0.50–1.10)
GLUCOSE: 82 mg/dL (ref 70–99)
POTASSIUM: 4.3 meq/L (ref 3.5–5.3)
Sodium: 141 mEq/L (ref 135–145)
Total Protein: 6.8 g/dL (ref 6.0–8.3)

## 2014-10-18 LAB — LIPID PANEL
CHOL/HDL RATIO: 3.1 ratio
Cholesterol: 242 mg/dL — ABNORMAL HIGH (ref 0–200)
HDL: 79 mg/dL (ref >=46)
LDL CALC: 145 mg/dL — AB (ref 0–99)
Triglycerides: 92 mg/dL (ref <150)
VLDL: 18 mg/dL (ref 0–40)

## 2014-10-18 LAB — CBC WITH DIFFERENTIAL/PLATELET
BASOS PCT: 1 % (ref 0–1)
Basophils Absolute: 0 10*3/uL (ref 0.0–0.1)
Eosinophils Absolute: 0.3 10*3/uL (ref 0.0–0.7)
Eosinophils Relative: 7 % — ABNORMAL HIGH (ref 0–5)
HEMATOCRIT: 38.5 % (ref 36.0–46.0)
Hemoglobin: 12.6 g/dL (ref 12.0–15.0)
LYMPHS PCT: 38 % (ref 12–46)
Lymphs Abs: 1.8 10*3/uL (ref 0.7–4.0)
MCH: 28.8 pg (ref 26.0–34.0)
MCHC: 32.7 g/dL (ref 30.0–36.0)
MCV: 87.9 fL (ref 78.0–100.0)
MPV: 10.1 fL (ref 8.6–12.4)
Monocytes Absolute: 0.4 10*3/uL (ref 0.1–1.0)
Monocytes Relative: 8 % (ref 3–12)
NEUTROS ABS: 2.2 10*3/uL (ref 1.7–7.7)
Neutrophils Relative %: 46 % (ref 43–77)
Platelets: 248 10*3/uL (ref 150–400)
RBC: 4.38 MIL/uL (ref 3.87–5.11)
RDW: 14.7 % (ref 11.5–15.5)
WBC: 4.8 10*3/uL (ref 4.0–10.5)

## 2014-10-18 LAB — POCT URINALYSIS DIPSTICK
BILIRUBIN UA: NEGATIVE
GLUCOSE UA: NEGATIVE
Ketones, UA: NEGATIVE
Leukocytes, UA: NEGATIVE
Nitrite, UA: NEGATIVE
Protein, UA: NEGATIVE
RBC UA: NEGATIVE
Spec Grav, UA: 1.015
UROBILINOGEN UA: NEGATIVE
pH, UA: 7.5

## 2014-10-18 MED ORDER — FELODIPINE ER 10 MG PO TB24: ORAL_TABLET | ORAL | Status: DC

## 2014-10-18 MED ORDER — ATORVASTATIN CALCIUM 40 MG PO TABS: 40.0000 mg | ORAL_TABLET | Freq: Every day | ORAL | Status: DC

## 2014-10-18 MED ORDER — DEXLANSOPRAZOLE 60 MG PO CPDR: DELAYED_RELEASE_CAPSULE | ORAL | Status: DC

## 2014-10-18 MED ORDER — BISOPROLOL-HYDROCHLOROTHIAZIDE 5-6.25 MG PO TABS: ORAL_TABLET | ORAL | Status: DC

## 2014-10-18 NOTE — Progress Notes (Signed)
Subjective:    Patient ID: [name] [name], female    DOB: 10-Dec-1950, 64 y.o.   MRN: [id number]  HPI She is here for complete examination. She is now retired but helping with the care of her husband. He is now in dialysis. He also has underlying diabetes and has had  BKA  Bilaterally.She also has started with a trainer who is apparently going to help her with her overall health. She does have arthritis mainly in her hands. He does tend to get better as the day goes on. Her reflux is responding well to Dexilant. She has tried other PPIs without much success. She monitors her blood pressure at home and her machine is accurate. She does tend to have whitecoat hypertension. She also has a history of hyperlipidemia and is on Lipitor. She had a DEXA scan which did show osteopenia. She has had no difficulty with abdominal pain, change in bowel habits. She does see a cardiologist periodically. Health maintenance and immunizations was reviewed.   Review of Systems  All other systems reviewed and are negative.      Objective:   Physical Exam BP 150/90 mmHg  Pulse 60  Ht 5' 4.5" (1.638 m)  Wt 196 lb 6.4 oz (89.086 kg)  BMI 33.20 kg/m2  SpO2 98%  General Appearance:    Alert, cooperative, no distress, appears stated age  Head:    Normocephalic, without obvious abnormality, atraumatic  Eyes:    PERRL, conjunctiva/corneas clear, EOM's intact, fundi    benign  Ears:    Normal TM's and external ear canals  Nose:   Nares normal, mucosa normal, no drainage or sinus   tenderness  Throat:   Lips, mucosa, and tongue normal; teeth and gums normal  Neck:   Supple, no lymphadenopathy;  thyroid:  no   enlargement/tenderness/nodules; no carotid   bruit or JVD  Back:    Spine nontender, no curvature, ROM normal, no CVA     tenderness  Lungs:     Clear to auscultation bilaterally without wheezes, rales or     ronchi; respirations unlabored  Chest Wall:    No tenderness or deformity   Heart:    Regular rate  and rhythm, S1 and S2 normal, no murmur, rub   or gallop  Breast Exam:    Deferred to GYN  Abdomen:     Soft, non-tender, nondistended, normoactive bowel sounds,    no masses, no hepatosplenomegaly  Genitalia:    Deferred to GYN     Extremities:   No clubbing, cyanosis or edema  Pulses:   2+ and symmetric all extremities  Skin:   Skin color, texture, turgor normal, no rashes or lesions  Lymph nodes:   Cervical, supraclavicular, and axillary nodes normal  Neurologic:   CNII-XII intact, normal strength, sensation and gait; reflexes 2+ and symmetric throughout          Psych:   Normal mood, affect, hygiene and grooming.          Assessment & Plan:  Routine general medical examination at a health care facility - Plan: POCT Urinalysis Dipstick, CBC with Differential/Platelet, Comprehensive metabolic panel, Lipid panel  Arthritis  Gastroesophageal reflux disease without esophagitis - Plan: dexlansoprazole (DEXILANT) 60 MG capsule  Essential hypertension, benign - Plan: felodipine (PLENDIL) 10 MG 24 hr tablet, bisoprolol-hydrochlorothiazide (ZIAC) 5-6.25 MG per tablet  Hyperlipidemia - Plan: Lipid panel, atorvastatin (LIPITOR) 40 MG tablet  Osteopenia Strongly encouraged her to continue with her lifestyle modification. Discussed diet,  exercise and continuing with her trainer. Also discussed her retirement and strongly encouraged her to keep her mind and body busy. Recommend she get down to a dress size of 8-10. She will continue on her other medications.Also recommend Tylenol for her arthritis.

## 2014-10-18 NOTE — Patient Instructions (Addendum)
150 minutes a week of something physical..Translates into about 20 minutes a day of something. Cut back on "white food" Set a dress size of 8-10 as your goal. As your clothes start to get looser you need to get rid of them If the arthritis gets bad don't hesitate to start with Tylenol

## 2015-02-02 ENCOUNTER — Telehealth: Payer: Self-pay | Admitting: Family Medicine

## 2015-02-02 NOTE — Telephone Encounter (Signed)
Pt called and wanted to let you know that her dr put her on prn Physician Recommended Nutriceuticals PRN Omega Benefits Fish Oil and put her on eye drops to help her dry eyes. He told her she needed to call and let you know that he had added this RX's pt can be reached at [phone]

## 2015-02-28 ENCOUNTER — Telehealth: Payer: Self-pay | Admitting: Medical

## 2015-02-28 NOTE — Telephone Encounter (Signed)
error 

## 2015-02-28 NOTE — Telephone Encounter (Signed)
[name] PomfretMary jane from [facility] called and stated that she needed some medical records and proof that she was on

## 2015-06-20 ENCOUNTER — Encounter: Payer: Self-pay | Admitting: Family Medicine

## 2015-06-20 ENCOUNTER — Ambulatory Visit (INDEPENDENT_AMBULATORY_CARE_PROVIDER_SITE_OTHER): Payer: BC Managed Care – PPO | Admitting: Family Medicine

## 2015-06-20 VITALS — BP 150/92 | HR 63 | Wt 196.0 lb

## 2015-06-20 DIAGNOSIS — M79642 Pain in left hand: Secondary | ICD-10-CM

## 2015-06-20 DIAGNOSIS — M1990 Unspecified osteoarthritis, unspecified site: Secondary | ICD-10-CM | POA: Diagnosis not present

## 2015-06-20 NOTE — Progress Notes (Signed)
   Subjective:    Patient ID: [name] [name], female    DOB: [birth year], 65 y.o.   MRN: [id number]  HPI He complains of a one-week history of left hand pain. No history of injury. It is constant and does not change with activities. She does have a previous history of arthritis but usually this is motion related. She's had no other joints involved no rash, fever, chills or her medications were reviewed.   Review of Systems     Objective:   Physical Exam Urgent and in no distress. No joint pain or swelling was noted. Normal strength and sensation. No swelling noted in the wrists, elbows, knees.       Assessment & Plan:  Arthritis  Pain of left hand Explained that it did not appear to be gout or any other inflammatory arthropathy. Recommend conservative care with 2 Aleve twice per day for the next week. She will call if continued difficulty.

## 2015-06-20 NOTE — Patient Instructions (Signed)
take2 Aleve twice per day for the next week and see what that does for the pain

## 2015-09-28 NOTE — Progress Notes (Signed)
HPI The patient presents for evaluation of dyspnea. She had a history of nonobstructive coronary disease. She had a cardiac catheterization in 2006 with 40-50% proximal LAD stenosis and mild plaquing elsewhere.   She apparently had a negative stress perfusion study in 2009.  When I saw her recently she was having some dyspnea.  I sent her for a Lexiscan Myoview and BNP and these were normal.  Since I saw her she has been doing well.  She is having an URI.  In addition she was also having some chest pain that she thinks was GI.  This has resolved.  She has been going to the gym a few times per week recently.   Allergies  Allergen Reactions  . Penicillins     Current Outpatient Prescriptions  Medication Sig Dispense Refill  . aspirin 81 MG tablet Take 81 mg by mouth daily.    Marland Kitchen. atorvastatin (LIPITOR) 40 MG tablet Take 1 tablet (40 mg total) by mouth daily. 90 tablet 3  . bisoprolol-hydrochlorothiazide (ZIAC) 5-6.25 MG per tablet take 1 tablet by mouth once daily 90 tablet 3  . dexlansoprazole (DEXILANT) 60 MG capsule take 1 capsule by mouth once daily 90 capsule 3  . felodipine (PLENDIL) 10 MG 24 hr tablet take 1 tablet by mouth once daily 90 tablet 3  . fexofenadine (ALLEGRA) 180 MG tablet Take 1 tablet (180 mg total) by mouth daily. (Patient taking differently: Take 180 mg by mouth as needed. ) 90 tablet 3  . FIBER PO Take 500 mg by mouth daily.    . IRON PO Take 1,000 mg by mouth.    . Multiple Vitamin (MULTIVITAMIN) capsule Take 1 capsule by mouth daily.      . Olopatadine HCl (PAZEO) 0.7 % SOLN Apply 1 drop to eye daily. Both Eyes    . Omega-3 Fatty Acids (FISH OIL PO) Take by mouth daily. 4 capsules daily    . PREVIDENT 5000 ENAMEL PROTECT 1.1-5 % PSTE daily.  0  . vitamin E 400 UNIT capsule Take 400 Units by mouth daily.     No current facility-administered medications for this visit.    Past Medical History  Diagnosis Date  . Arthritis   . Hypertension   . Hyperlipidemia   .  Osteopenia   . ASHD (arteriosclerotic heart disease)   . Eczema   . GERD (gastroesophageal reflux disease)   . Hemorrhoid     internal and external, general surgery consult 04/2011  . Anemia   . Blood in stool     GI consult 2012, EGD and colonoscopy  . History of echocardiogram 02/17/2008    normal LV structure and function w/ EF >55%, trace MR, TR and PI  . History of nuclear stress test 02/2008    no significant ischemia, low risk scan, post stress EF 68%  . H/O bone density study 07/09/2006    [facility]; [facility]    Past Surgical History  Procedure Laterality Date  . Abdominal hysterectomy    . Colonoscopy  2012, 2008    Dr. Loreta AveMann  . Esophagogastroduodenoscopy  2012, 2008    Dr. Loreta AveMann    ROS:   As stated in the HPI and negative for all other systems.  PHYSICAL EXAM BP 178/100 mmHg  Pulse 65  Ht 5\' 5"  (1.651 m)  Wt 205 lb (92.987 kg)  BMI 34.11 kg/m2  GENERAL:  Well appearing HEENT:  Pupils equal round and reactive, fundi not visualized, oral mucosa unremarkable NECK:  No jugular venous distention, waveform within normal limits, carotid upstroke brisk and symmetric, no bruits, no thyromegaly LYMPHATICS:  No cervical, inguinal adenopathy LUNGS:  Clear to auscultation bilaterally BACK:  No CVA tenderness CHEST:  Unremarkable HEART:  PMI not displaced or sustained,S1 and S2 within normal limits, no S3, no S4, no clicks, no rubs, no murmurs ABD:  Flat, positive bowel sounds normal in frequency in pitch, no bruits, no rebound, no guarding, no midline pulsatile mass, no hepatomegaly, no splenomegaly EXT:  2 plus pulses throughout, no edema, no cyanosis no clubbing SKIN:  No rashes no nodules NEURO:  Cranial nerves II through XII grossly intact, motor grossly intact throughout [facility]:  Cognitively intact, oriented to person place and time   EKG:  Sinus rhythm, rate 65 to nonspecific inferior T-wave flattening, early transition in lead V2.  09/29/2015   ASSESSMENT  AND PLAN  DYSPNEA:  This has improved and her stress test and BNP were normal.  No further testing is indicated.    OVERWEIGHT:  The patient understands the need to lose weight with diet and exercise. We have discussed specific strategies for this. She is up about 10 lbs from her lowest.    HTN:  Her BP is upper limits of elevated .    She will continue on medications as needed.  She says that her readings at home are normal and she has had her cuff checked.  I think if she could lose the 10 lbs that she gained that she could avoid another medication.

## 2015-09-29 ENCOUNTER — Encounter: Payer: Self-pay | Admitting: Cardiology

## 2015-09-29 ENCOUNTER — Ambulatory Visit: Payer: BC Managed Care – PPO | Admitting: Cardiology

## 2015-09-29 ENCOUNTER — Ambulatory Visit (INDEPENDENT_AMBULATORY_CARE_PROVIDER_SITE_OTHER): Payer: BC Managed Care – PPO | Admitting: Cardiology

## 2015-09-29 VITALS — BP 178/100 | HR 65 | Ht 65.0 in | Wt 205.0 lb

## 2015-09-29 DIAGNOSIS — I10 Essential (primary) hypertension: Secondary | ICD-10-CM | POA: Diagnosis not present

## 2015-09-29 NOTE — Patient Instructions (Signed)
Your physician recommends that you schedule a follow-up appointment in: As Needed    

## 2015-10-24 ENCOUNTER — Ambulatory Visit (INDEPENDENT_AMBULATORY_CARE_PROVIDER_SITE_OTHER): Payer: BC Managed Care – PPO | Admitting: Family Medicine

## 2015-10-24 ENCOUNTER — Encounter: Payer: Self-pay | Admitting: Family Medicine

## 2015-10-24 VITALS — BP 150/98 | HR 59 | Ht 65.5 in | Wt 202.0 lb

## 2015-10-24 DIAGNOSIS — M8580 Other specified disorders of bone density and structure, unspecified site: Secondary | ICD-10-CM

## 2015-10-24 DIAGNOSIS — K219 Gastro-esophageal reflux disease without esophagitis: Secondary | ICD-10-CM | POA: Diagnosis not present

## 2015-10-24 DIAGNOSIS — Z0000 Encounter for general adult medical examination without abnormal findings: Secondary | ICD-10-CM

## 2015-10-24 DIAGNOSIS — M1990 Unspecified osteoarthritis, unspecified site: Secondary | ICD-10-CM

## 2015-10-24 DIAGNOSIS — I10 Essential (primary) hypertension: Secondary | ICD-10-CM

## 2015-10-24 DIAGNOSIS — Z1159 Encounter for screening for other viral diseases: Secondary | ICD-10-CM

## 2015-10-24 DIAGNOSIS — E785 Hyperlipidemia, unspecified: Secondary | ICD-10-CM

## 2015-10-24 LAB — CBC WITH DIFFERENTIAL/PLATELET
BASOS PCT: 1 %
Basophils Absolute: 50 cells/uL (ref 0–200)
Eosinophils Absolute: 350 cells/uL (ref 15–500)
Eosinophils Relative: 7 %
HCT: 38.7 % (ref 35.0–45.0)
HEMOGLOBIN: 12.6 g/dL (ref 11.7–15.5)
LYMPHS ABS: 1800 {cells}/uL (ref 850–3900)
Lymphocytes Relative: 36 %
MCH: 29.8 pg (ref 27.0–33.0)
MCHC: 32.6 g/dL (ref 32.0–36.0)
MCV: 91.5 fL (ref 80.0–100.0)
MPV: 10.1 fL (ref 7.5–12.5)
Monocytes Absolute: 500 cells/uL (ref 200–950)
Monocytes Relative: 10 %
NEUTROS ABS: 2300 {cells}/uL (ref 1500–7800)
Neutrophils Relative %: 46 %
PLATELETS: 211 10*3/uL (ref 140–400)
RBC: 4.23 MIL/uL (ref 3.80–5.10)
RDW: 14.3 % (ref 11.0–15.0)
WBC: 5 10*3/uL (ref 4.0–10.5)

## 2015-10-24 LAB — COMPREHENSIVE METABOLIC PANEL
ALBUMIN: 4.3 g/dL (ref 3.6–5.1)
ALK PHOS: 63 U/L (ref 33–130)
ALT: 13 U/L (ref 6–29)
AST: 19 U/L (ref 10–35)
BUN: 18 mg/dL (ref 7–25)
CO2: 26 mmol/L (ref 20–31)
Calcium: 9.5 mg/dL (ref 8.6–10.4)
Chloride: 107 mmol/L (ref 98–110)
Creat: 1.1 mg/dL — ABNORMAL HIGH (ref 0.50–0.99)
Glucose, Bld: 86 mg/dL (ref 65–99)
POTASSIUM: 4.3 mmol/L (ref 3.5–5.3)
Sodium: 143 mmol/L (ref 135–146)
TOTAL PROTEIN: 6.6 g/dL (ref 6.1–8.1)
Total Bilirubin: 0.4 mg/dL (ref 0.2–1.2)

## 2015-10-24 LAB — POCT URINALYSIS DIPSTICK
Bilirubin, UA: NEGATIVE
Blood, UA: NEGATIVE
Glucose, UA: NEGATIVE
KETONES UA: NEGATIVE
LEUKOCYTES UA: NEGATIVE
Nitrite, UA: NEGATIVE
Spec Grav, UA: 1.025
UROBILINOGEN UA: NEGATIVE
pH, UA: 6

## 2015-10-24 LAB — LIPID PANEL
Cholesterol: 191 mg/dL (ref 125–200)
HDL: 72 mg/dL (ref >=46)
LDL Cholesterol: 99 mg/dL (ref <130)
Total CHOL/HDL Ratio: 2.7 Ratio (ref <=5.0)
Triglycerides: 99 mg/dL (ref <150)
VLDL: 20 mg/dL (ref <30)

## 2015-10-24 LAB — HEPATITIS C ANTIBODY: HCV AB: NEGATIVE

## 2015-10-24 MED ORDER — BISOPROLOL-HYDROCHLOROTHIAZIDE 5-6.25 MG PO TABS: ORAL_TABLET | ORAL | Status: DC

## 2015-10-24 NOTE — Patient Instructions (Addendum)
150 minutes a week of something physical. At translates into roughly 20 minutes every day. A way to cut back on carbohydrates is cut back on white food Try Flonase regularly for your allergies To cut back on the use of Dexilant to every other day and even further

## 2015-10-24 NOTE — Progress Notes (Signed)
Subjective:    Patient ID: [name] [name], female    DOB: 04-07-1951, 65 y.o.   MRN: [id number]  HPI She is here for complete examination. She does have questions about weight loss. She has been exercising 3 days per week and has made a slight effort towards improving her eating habits. She does have underlying allergies with sneezing, itchy watery eyes, rhinorrhea. She has been using Allegra. He uses Dexilant her reflux symptoms. She does see her gynecologist regularly. She continues on her blood pressure medications and is having no difficulty with them. He is on multiple dietary supplements. She takes Lipitor for her lipids. She does have underlying arthritis but this causes minimal difficulties. She also has a previous history of osteopenia and did have a DEXA scan done in 2004 but no follow-up since then. She is retired and very much enjoying her retirement. Family and social history as well as health maintenance and immunizations were reviewed. She has no other concerns or complaints.   Review of Systems  All other systems reviewed and are negative.      Objective:   Physical Exam BP 150/98 mmHg  Pulse 59  Ht 5' 5.5" (1.664 m)  Wt 202 lb (91.627 kg)  BMI 33.09 kg/m2  General Appearance:    Alert, cooperative, no distress, appears stated age  Head:    Normocephalic, without obvious abnormality, atraumatic  Eyes:    PERRL, conjunctiva/corneas clear, EOM's intact, fundi    benign  Ears:    Normal TM's and external ear canals  Nose:   Nares normal, mucosa normal, no drainage or sinus   tenderness  Throat:   Lips, mucosa, and tongue normal; teeth and gums normal  Neck:   Supple, no lymphadenopathy;  thyroid:  no   enlargement/tenderness/nodules; no carotid   bruit or JVD  Back:    Spine nontender, no curvature, ROM normal, no CVA     tenderness  Lungs:     Clear to auscultation bilaterally without wheezes, rales or     ronchi; respirations unlabored  Chest Wall:    No tenderness or  deformity   Heart:    Regular rate and rhythm, S1 and S2 normal, no murmur, rub   or gallop  Breast Exam:    Deferred to GYN  Abdomen:     Soft, non-tender, nondistended, normoactive bowel sounds,    no masses, no hepatosplenomegaly  Genitalia:    Deferred to GYN     Extremities:   No clubbing, cyanosis or edema  Pulses:   2+ and symmetric all extremities  Skin:   Skin color, texture, turgor normal, no rashes or lesions  Lymph nodes:   Cervical, supraclavicular, and axillary nodes normal  Neurologic:   CNII-XII intact, normal strength, sensation and gait; reflexes 2+ and symmetric throughout          Psych:   Normal mood, affect, hygiene and grooming.          Assessment & Plan:  Routine general medical examination at a health care facility - Plan: CBC with Differential/Platelet, Comprehensive metabolic panel, Lipid panel, POCT urinalysis dipstick  Gastroesophageal reflux disease without esophagitis  Essential hypertension, benign - Plan: CBC with Differential/Platelet, Comprehensive metabolic panel, bisoprolol-hydrochlorothiazide (ZIAC) 5-6.25 MG tablet  Hyperlipidemia - Plan: Lipid panel  Osteopenia  Arthritis  Need for hepatitis C screening test - Plan: Hepatitis C antibody 150 minutes a week of something physical. At translates into roughly 20 minutes every day. A way to cut back  on carbohydrates is cut back on white food Try Flonase regularly for your allergies To cut back on the use of Dexilant to every other day and even further Current Ziac was increased. She is to return here in one month for a blood pressure recheck

## 2015-11-21 ENCOUNTER — Encounter: Payer: Self-pay | Admitting: Family Medicine

## 2015-11-21 ENCOUNTER — Ambulatory Visit (INDEPENDENT_AMBULATORY_CARE_PROVIDER_SITE_OTHER): Payer: BC Managed Care – PPO | Admitting: Family Medicine

## 2015-11-21 VITALS — BP 148/102 | HR 54 | Ht 66.0 in | Wt 198.0 lb

## 2015-11-21 DIAGNOSIS — I10 Essential (primary) hypertension: Secondary | ICD-10-CM

## 2015-11-21 NOTE — Progress Notes (Signed)
   Subjective:    Patient ID: [name] [name], female    DOB: Jan 01, 1951, 65 y.o.   MRN: [id number]  HPI She is here she is taking Zetia as prescribed. He does check her blood pressures at home and her machine has been compared ours. He is accurate. The readings at home are averaging in the 140/90 range.  Review of Systems     Objective:   Physical Exam Alert and in no distress. Blood pressure is recorded       Assessment & Plan:  Essential hypertension, benign Since her readings at home are in the normal range, I will continue her on her present medication regimen. I then discussed diet and exercise with her. Apparently she has lost 1 address size which I complimented her on. Did discuss the fact that we could potentially readjust medicines if she continues to exercise and lose weight.

## 2015-12-23 ENCOUNTER — Other Ambulatory Visit: Payer: Self-pay | Admitting: Family Medicine

## 2015-12-23 DIAGNOSIS — K219 Gastro-esophageal reflux disease without esophagitis: Secondary | ICD-10-CM

## 2015-12-23 DIAGNOSIS — E785 Hyperlipidemia, unspecified: Secondary | ICD-10-CM

## 2015-12-23 DIAGNOSIS — I10 Essential (primary) hypertension: Secondary | ICD-10-CM

## 2015-12-28 ENCOUNTER — Telehealth: Payer: Self-pay | Admitting: Family Medicine

## 2015-12-28 NOTE — Telephone Encounter (Signed)
Pt called stating that she was told that she would have to pay a large amount for Dexilant when she tried to get a refill the other day due to needing a prior auth

## 2015-12-30 NOTE — Telephone Encounter (Signed)
Called pharmacy & going thru ins for $90 for 90 days.  Went online & activated discount card and went thru for $20.  Pt informed

## 2016-02-29 ENCOUNTER — Encounter: Payer: Self-pay | Admitting: Family Medicine

## 2016-02-29 ENCOUNTER — Ambulatory Visit (INDEPENDENT_AMBULATORY_CARE_PROVIDER_SITE_OTHER): Payer: BC Managed Care – PPO | Admitting: Family Medicine

## 2016-02-29 VITALS — BP 150/78 | HR 58 | Wt 202.0 lb

## 2016-02-29 DIAGNOSIS — M25561 Pain in right knee: Secondary | ICD-10-CM

## 2016-02-29 NOTE — Progress Notes (Signed)
   Subjective:    Patient ID: [name] BaltimoreEster J [name], female    DOB: [birth year], 65 y.o.   MRN: [id number]  HPI She has a one-day history of right knee pain with no history of injury or overuse. She now notes that the pain is there constantly and not just with physical activity. No popping, locking or grinding. No other joints are involved.   Review of Systems     Objective:   Physical Exam Exam of the right knee shows no effusion. Full motion. this noted. Medial and lateral collateral ligaments intact. Negative McMurray's testing as well as anterior drawer. Slight tenderness along the lateral pole of the patella.       Assessment & Plan:  Acute pain of right knee I explained that nothing significant show deficits causing her pain and recommend conservative care with 2 Aleve twice per day. She continues have difficulty she is to return here for further care.

## 2016-02-29 NOTE — Patient Instructions (Signed)
Take 2 Aleve twice per day for the next 7-10 days. If you don't improve or get worse, call me.

## 2016-04-02 ENCOUNTER — Encounter: Payer: Self-pay | Admitting: Family Medicine

## 2016-04-02 ENCOUNTER — Ambulatory Visit
Admission: RE | Admit: 2016-04-02 | Discharge: 2016-04-02 | Disposition: A | Discharge status: Home / Self Care | Payer: BC Managed Care – PPO | Source: Ambulatory Visit | Attending: Family Medicine | Admitting: Family Medicine

## 2016-04-02 ENCOUNTER — Ambulatory Visit (INDEPENDENT_AMBULATORY_CARE_PROVIDER_SITE_OTHER): Payer: BC Managed Care – PPO | Admitting: Family Medicine

## 2016-04-02 VITALS — BP 128/80 | HR 82 | Wt 203.8 lb

## 2016-04-02 DIAGNOSIS — M25561 Pain in right knee: Secondary | ICD-10-CM

## 2016-04-02 NOTE — Progress Notes (Signed)
   Subjective:    Patient ID: [name] [name], female    DOB: 1950-09-27, 65 y.o.   MRN: [id number]  HPI She is here for recheck. She was seen approximately one month ago with a history of right knee pain. She has been using Aleve and did get some relief, it. No popping, locking or grinding.   Review of Systems     Objective:   Physical Exam Right knee exam shows a minimal effusion with lateral joint discomfort and positive McMurray's laterally. Anterior drawer negative. Lateral and medial collateral ligaments intact.       Assessment & Plan:  Acute pain of right knee - Plan: DG Knee Complete 4 Views Right X-ray shows minimal changes. I will have her come back for an injection to see if this will help.

## 2016-04-03 ENCOUNTER — Ambulatory Visit (INDEPENDENT_AMBULATORY_CARE_PROVIDER_SITE_OTHER): Payer: BC Managed Care – PPO | Admitting: Family Medicine

## 2016-04-03 DIAGNOSIS — M1711 Unilateral primary osteoarthritis, right knee: Secondary | ICD-10-CM | POA: Diagnosis not present

## 2016-04-03 MED ORDER — LIDOCAINE HCL 2 % IJ SOLN
4.0000 mL | Freq: Once | INTRAMUSCULAR | Status: AC
  Administered 2016-04-03: 80 mg via INTRADERMAL

## 2016-04-03 MED ORDER — TRIAMCINOLONE ACETONIDE 40 MG/ML IJ SUSP
40.0000 mg | Freq: Once | INTRAMUSCULAR | Status: AC
  Administered 2016-04-03: 40 mg via INTRAMUSCULAR

## 2016-04-03 NOTE — Progress Notes (Signed)
   Subjective:    Patient ID: [name] [name], female    DOB: June 29, 1950, 65 y.o.   MRN: [id number]  HPI She is here for an injection of her right knee. Recent x-rays did show some minimal changes however she is having a fair amount of pain from this.   Review of Systems     Objective:   Physical Exam full motion of the knee without any pain. No effusion noted.    Assessment & Plan:  Arthritis of right knee  Lateral aspect of the right knee was prepped with Betadine. Joint was identified and marked.40 mg of Kenalog and 3 mL of Xylocaine was injected into the joint without difficulty. I explained the potential benefit and the need for repeat of this. Explained that if this does not last very long, referral to orthopedics for definitive injection would be appropriate.

## 2016-06-14 ENCOUNTER — Encounter: Payer: Self-pay | Admitting: Family Medicine

## 2016-06-14 ENCOUNTER — Ambulatory Visit (INDEPENDENT_AMBULATORY_CARE_PROVIDER_SITE_OTHER): Payer: BC Managed Care – PPO | Admitting: Family Medicine

## 2016-06-14 VITALS — BP 130/82 | HR 64 | Temp 98.5°F | Wt 208.0 lb

## 2016-06-14 DIAGNOSIS — J111 Influenza due to unidentified influenza virus with other respiratory manifestations: Secondary | ICD-10-CM

## 2016-06-14 DIAGNOSIS — R52 Pain, unspecified: Secondary | ICD-10-CM

## 2016-06-14 LAB — POC INFLUENZA A&B (BINAX/QUICKVUE)
INFLUENZA B, POC: NEGATIVE
Influenza A, POC: NEGATIVE

## 2016-06-14 NOTE — Patient Instructions (Signed)

## 2016-06-14 NOTE — Progress Notes (Signed)
   Subjective:    Patient ID: [name] BaltimoreEster J Dugue, female    DOB: Jul 02, 1950, 66 y.o.   MRN: [id number]  HPI She complains of a five-day history it started with rhinorrhea followed the next day by fatigue, fever, chills, malaise, headache and myalgias.   Review of Systems     Objective:   Physical Exam Alert and in no distress. Tympanic membranes and canals are normal. Pharyngeal area is normal. Neck is supple without adenopathy or thyromegaly. Cardiac exam shows a regular sinus rhythm without murmurs or gallops. Lungs are clear to auscultation.' Flu test negative      Assessment & Plan:  Influenza  Body aches - Plan: POC Influenza A&B (Binax test) Explained in spite of the negative test, she did indeed have influenza and recommended supportive care. Also recommended calling if her condition worsened especially in regard to cough, congestion and shortness of breath.

## 2016-10-30 ENCOUNTER — Other Ambulatory Visit: Payer: Self-pay | Admitting: Family Medicine

## 2016-10-30 DIAGNOSIS — I10 Essential (primary) hypertension: Secondary | ICD-10-CM

## 2016-11-07 ENCOUNTER — Encounter (HOSPITAL_COMMUNITY): Payer: Self-pay

## 2016-11-07 ENCOUNTER — Emergency Department (HOSPITAL_COMMUNITY)
Admission: EM | Admit: 2016-11-07 | Discharge: 2016-11-07 | Disposition: A | Discharge status: Home / Self Care | Payer: Medicare Other | Attending: Emergency Medicine | Admitting: Emergency Medicine

## 2016-11-07 DIAGNOSIS — Z7982 Long term (current) use of aspirin: Secondary | ICD-10-CM | POA: Diagnosis not present

## 2016-11-07 DIAGNOSIS — I2510 Atherosclerotic heart disease of native coronary artery without angina pectoris: Secondary | ICD-10-CM | POA: Diagnosis not present

## 2016-11-07 DIAGNOSIS — I119 Hypertensive heart disease without heart failure: Secondary | ICD-10-CM | POA: Diagnosis not present

## 2016-11-07 DIAGNOSIS — I10 Essential (primary) hypertension: Secondary | ICD-10-CM

## 2016-11-07 DIAGNOSIS — R51 Headache: Secondary | ICD-10-CM | POA: Insufficient documentation

## 2016-11-07 DIAGNOSIS — Z79899 Other long term (current) drug therapy: Secondary | ICD-10-CM | POA: Insufficient documentation

## 2016-11-07 DIAGNOSIS — Z880 Allergy status to penicillin: Secondary | ICD-10-CM | POA: Diagnosis not present

## 2016-11-07 NOTE — ED Provider Notes (Signed)
[facility]-EMERGENCY DEPT Provider Note   CSN: [id number] Arrival date & time: 11/07/16  0204     History   Chief Complaint Chief Complaint  Patient presents with  . Hypertension    HPI [name] [name] is a 66 y.o. female.  HPI   Patient is a 66 year old female with history of hypertension, hyperlipidemia, GERD and anemia who presents to the ED with complaints of hypertension, onset last night. Patient reports around 9 PM when she was getting ready for bed she began to have a mild right temporal headache. She reports around 2 AM she woke up with a continued headache and states when she took her blood pressure was 199/101. She reports taking all 3 of her blood pressure pills at that time. She notes since arrival to the ED her headache has resolved and denies any other pain or complaints. Patient states she has been taking her blood pressure medications as prescribed (every morning) and denies any recent changes in her medications. Denies fever, chills, lightheadedness, dizziness, visual changes, chest pain, shortness of breath, palpitations, abdominal pain, vomiting, numbness, weakness, syncope, seizure.   PCP- Dr. Susann GivensLalonde  Past Medical History:  Diagnosis Date  . Anemia   . Arthritis   . ASHD (arteriosclerotic heart disease)   . Blood in stool    GI consult 2012, EGD and colonoscopy  . Eczema   . GERD (gastroesophageal reflux disease)   . H/O bone density study 07/09/2006   osteopenia; ([facility])  . Hemorrhoid    internal and external, general surgery consult 04/2011  . History of echocardiogram 02/17/2008   normal LV structure and function w/ EF >55%, trace MR, TR and PI  . History of nuclear stress test 02/2008   no significant ischemia, low risk scan, post stress EF 68%  . Hyperlipidemia   . Hypertension   . Osteopenia     Patient Active Problem List   Diagnosis Date Noted  . Essential hypertension, benign 10/16/2011  . Hyperlipidemia 10/16/2011  . Osteopenia  10/16/2011  . Hemorrhoids 10/16/2011  . GERD (gastroesophageal reflux disease) 10/16/2011    Past Surgical History:  Procedure Laterality Date  . ABDOMINAL HYSTERECTOMY    . COLONOSCOPY  2012, 2008   Dr. Loreta AveMann  . ESOPHAGOGASTRODUODENOSCOPY  2012, 2008   Dr. Loreta AveMann    OB History    No data available       Home Medications    Prior to Admission medications   Medication Sig Start Date End Date Taking? Authorizing Provider  aspirin 81 MG tablet Take 81 mg by mouth daily.    [provider]  atorvastatin (LIPITOR) 40 MG tablet take 1 tablet by mouth once daily 12/23/15   Ronnald NianLalonde, John C, MD  bisoprolol-hydrochlorothiazide [facility](ZIAC) 5-6.25 MG tablet take 2 tablets by mouth once daily 10/30/16   Ronnald NianLalonde, John C, MD  DEXILANT 60 MG capsule take 1 capsule by mouth once daily 12/23/15   Ronnald NianLalonde, John C, MD  felodipine (PLENDIL) 10 MG 24 hr tablet take 1 tablet by mouth once daily 12/23/15   Ronnald NianLalonde, John C, MD  FIBER PO Take 500 mg by mouth daily.    [provider]  IRON PO Take 1,000 mg by mouth.    [provider]  Multiple Vitamin (MULTIVITAMIN) capsule Take 1 capsule by mouth daily.      [provider]  Olopatadine HCl (PAZEO) 0.7 % SOLN Apply 1 drop to eye daily. Both Eyes    [provider]  Omega-3 Fatty Acids (FISH OIL PO) Take by mouth daily. 4 capsules daily    [provider]  PREVIDENT 5000 ENAMEL PROTECT 1.1-5 % PSTE daily. 03/12/14   [provider]  vitamin E 400 UNIT capsule Take 400 Units by mouth daily.    [provider]    Family History Family History  Problem Relation Age of Onset  . Heart disease Mother        Pacemaker  . Diabetes Mother   . Cancer Father        lung  . Diabetes Father   . Diabetes Sister   . Diabetes Brother   . Heart disease Brother        1 brother died MI age 62 yo  . Cancer Other        maternal side,non first degree breast cancer  . Stroke Neg Hx   . Hypertension  Neg Hx   . Hyperlipidemia Neg Hx     Social History Social History  Substance Use Topics  . Smoking status: Never Smoker  . Smokeless tobacco: Never Used  . Alcohol use No     Allergies   Penicillins   Review of Systems Review of Systems  Neurological: Positive for headaches.  All other systems reviewed and are negative.    Physical Exam Updated Vital Signs BP 139/83   Pulse (!) 52   Temp 98.8 F (37.1 C) (Oral)   Resp 18   SpO2 95%   Physical Exam  Constitutional: She is oriented to person, place, and time. She appears well-developed and well-nourished. No distress.  HENT:  Head: Normocephalic and atraumatic.  Right Ear: Tympanic membrane normal.  Left Ear: Tympanic membrane normal.  Nose: Nose normal. Right sinus exhibits no maxillary sinus tenderness and no frontal sinus tenderness. Left sinus exhibits no maxillary sinus tenderness and no frontal sinus tenderness.  Mouth/Throat: Uvula is midline, oropharynx is clear and moist and mucous membranes are normal. No oropharyngeal exudate, posterior oropharyngeal edema, posterior oropharyngeal erythema or tonsillar abscesses. No tonsillar exudate.  Eyes: Conjunctivae and EOM are normal. Pupils are equal, round, and reactive to light. Right eye exhibits no discharge. Left eye exhibits no discharge. No scleral icterus.  Neck: Normal range of motion. Neck supple.  Cardiovascular: Normal rate, regular rhythm, normal heart sounds and intact distal pulses.   Pulmonary/Chest: Effort normal and breath sounds normal. No respiratory distress. She has no wheezes. She has no rales. She exhibits no tenderness.  Abdominal: Soft. Bowel sounds are normal. She exhibits no distension and no mass. There is no tenderness. There is no rebound and no guarding. No hernia.  Musculoskeletal: Normal range of motion. She exhibits no edema.  Lymphadenopathy:    She has no cervical adenopathy.  Neurological: She is alert and oriented to person,  place, and time. She has normal strength. No cranial nerve deficit or sensory deficit. Coordination normal.  Skin: Skin is warm and dry. She is not diaphoretic.  Nursing note and vitals reviewed.    ED Treatments / Results  Labs (all labs ordered are listed, but only abnormal results are displayed) Labs Reviewed - No data to display  EKG  EKG Interpretation None       Radiology No results found.  Procedures Procedures (including critical care time)  Medications Ordered in ED Medications - No data to display   Initial Impression / Assessment and Plan / ED Course  I have reviewed the triage vital signs and the nursing notes.  Pertinent  labs & imaging results that were available during my care of the patient were reviewed by me and considered in my medical decision making (see chart for details).     Patient reports having elevated blood pressure earlier this morning. Reports taking her blood pressure at 2 AM and notes it was 199/101. She states she took her blood pressure medication prior to arrival with resolution of her mild headache that was present.  On initial arrival to the ED, BP 172/92, remaining vitals stable. Exam unremarkable. No neuro deficits. On my initial evaluation BP 129/74, pt asymptomatic. No signs of hypertensive urgency.  Discussed with patient the need for close follow-up and management by their primary care physician. Patient reports having a scheduled appointment with her PCP in 2 days. Advised patient to continue taking her home medications as prescribed and continue monitoring her blood pressure at home. Discussed return precautions.  Final Clinical Impressions(s) / ED Diagnoses   Final diagnoses:  Hypertension, unspecified type    New Prescriptions New Prescriptions   No medications on file     Barrett Henle, Cordelia Poche 11/07/16 0701    Gilda Crease, MD 11/07/16 [phone]

## 2016-11-07 NOTE — Discharge Instructions (Signed)
Continue taking your home medications as prescribed. I recommend continue monitoring your blood pressure over the next week at home. Follow-up with your primary care provider at your scheduled appointment on Friday for your physical and also to discuss management of your blood pressure. Please return to the Emergency Department if symptoms worsen or new onset of fever, headache, dizziness, visual changes, chest pain, shortness of breath, abdominal pain, vomiting, numbness, weakness, significant, seizure.

## 2016-11-07 NOTE — ED Triage Notes (Signed)
Pt reports she checked her BP before bed tonight and it was 199/101. She denies vision changes but reports slight headache to right temporal area of head. She took her BP pills at 0200 this morning. A&Ox4, ambulatory, NAD

## 2016-11-09 ENCOUNTER — Encounter: Payer: Self-pay | Admitting: Family Medicine

## 2016-11-09 ENCOUNTER — Ambulatory Visit (INDEPENDENT_AMBULATORY_CARE_PROVIDER_SITE_OTHER): Payer: Medicare Other | Admitting: Family Medicine

## 2016-11-09 VITALS — BP 134/84 | HR 59 | Ht 64.5 in | Wt 200.0 lb

## 2016-11-09 DIAGNOSIS — K219 Gastro-esophageal reflux disease without esophagitis: Secondary | ICD-10-CM | POA: Diagnosis not present

## 2016-11-09 DIAGNOSIS — E785 Hyperlipidemia, unspecified: Secondary | ICD-10-CM | POA: Diagnosis not present

## 2016-11-09 DIAGNOSIS — Z23 Encounter for immunization: Secondary | ICD-10-CM

## 2016-11-09 DIAGNOSIS — Z0000 Encounter for general adult medical examination without abnormal findings: Secondary | ICD-10-CM

## 2016-11-09 DIAGNOSIS — M8580 Other specified disorders of bone density and structure, unspecified site: Secondary | ICD-10-CM | POA: Diagnosis not present

## 2016-11-09 DIAGNOSIS — I10 Essential (primary) hypertension: Secondary | ICD-10-CM

## 2016-11-09 LAB — COMPREHENSIVE METABOLIC PANEL
ALK PHOS: 64 U/L (ref 33–130)
ALT: 13 U/L (ref 6–29)
AST: 17 U/L (ref 10–35)
Albumin: 4.4 g/dL (ref 3.6–5.1)
BUN: 13 mg/dL (ref 7–25)
CALCIUM: 9.9 mg/dL (ref 8.6–10.4)
CHLORIDE: 104 mmol/L (ref 98–110)
CO2: 28 mmol/L (ref 20–31)
Creat: 1.01 mg/dL — ABNORMAL HIGH (ref 0.50–0.99)
Glucose, Bld: 94 mg/dL (ref 65–99)
Potassium: 4.4 mmol/L (ref 3.5–5.3)
Sodium: 141 mmol/L (ref 135–146)
TOTAL PROTEIN: 6.7 g/dL (ref 6.1–8.1)
Total Bilirubin: 0.6 mg/dL (ref 0.2–1.2)

## 2016-11-09 LAB — LIPID PANEL
CHOL/HDL RATIO: 2.8 ratio (ref <5.0)
Cholesterol: 193 mg/dL (ref <200)
HDL: 68 mg/dL (ref >50)
LDL Cholesterol: 96 mg/dL (ref <100)
TRIGLYCERIDES: 147 mg/dL (ref <150)
VLDL: 29 mg/dL (ref <30)

## 2016-11-09 LAB — POCT URINALYSIS DIP (PROADVANTAGE DEVICE)
BILIRUBIN UA: NEGATIVE
Blood, UA: NEGATIVE
Glucose, UA: NEGATIVE mg/dL
Ketones, POC UA: NEGATIVE mg/dL
Nitrite, UA: NEGATIVE
PH UA: 6 (ref 5.0–8.0)
Specific Gravity, Urine: 1.03
Urobilinogen, Ur: NEGATIVE

## 2016-11-09 LAB — CBC WITH DIFFERENTIAL/PLATELET
BASOS PCT: 1 %
Basophils Absolute: 45 cells/uL (ref 0–200)
EOS ABS: 360 {cells}/uL (ref 15–500)
Eosinophils Relative: 8 %
HEMATOCRIT: 39 % (ref 35.0–45.0)
HEMOGLOBIN: 12.8 g/dL (ref 11.7–15.5)
LYMPHS ABS: 1980 {cells}/uL (ref 850–3900)
Lymphocytes Relative: 44 %
MCH: 29.8 pg (ref 27.0–33.0)
MCHC: 32.8 g/dL (ref 32.0–36.0)
MCV: 90.9 fL (ref 80.0–100.0)
MPV: 10.4 fL (ref 7.5–12.5)
Monocytes Absolute: 360 cells/uL (ref 200–950)
Monocytes Relative: 8 %
Neutro Abs: 1755 cells/uL (ref 1500–7800)
Neutrophils Relative %: 39 %
PLATELETS: 247 10*3/uL (ref 140–400)
RBC: 4.29 MIL/uL (ref 3.80–5.10)
RDW: 14.5 % (ref 11.0–15.0)
WBC: 4.5 10*3/uL (ref 4.0–10.5)

## 2016-11-09 MED ORDER — DEXLANSOPRAZOLE 60 MG PO CPDR
1.0000 | DELAYED_RELEASE_CAPSULE | Freq: Every day | ORAL | 3 refills | Status: DC
Start: 2016-11-09 — End: 2017-12-13

## 2016-11-09 MED ORDER — FELODIPINE ER 10 MG PO TB24: 10.0000 mg | ORAL_TABLET | Freq: Every day | ORAL | 3 refills | Status: DC

## 2016-11-09 MED ORDER — BISOPROLOL-HYDROCHLOROTHIAZIDE 5-6.25 MG PO TABS: 2.0000 | ORAL_TABLET | Freq: Every day | ORAL | 3 refills | Status: DC

## 2016-11-09 MED ORDER — ATORVASTATIN CALCIUM 40 MG PO TABS: 40.0000 mg | ORAL_TABLET | Freq: Every day | ORAL | 3 refills | Status: DC

## 2016-11-09 NOTE — Progress Notes (Signed)
Subjective:    [name] [name] is a 66 y.o. female who presents for Preventative Services visit and chronic medical problems/CPE  Primary Care Provider Ronnald NianLalonde, John C, MD here for primary care  Current Health Care Team:  Dentist, Dr. Louis MeckelWalker  Eye doctor, Dr. Sharlot GowdaKoop  GYN Dr. Arelia SneddonMcComb   Exercise Current exercise habits: yes walking 45 min. Daily    Nutrition/Diet Current diet: no   Depression Screen Depression screen PHQ 2/9 11/09/2016  Decreased Interest 0  Down, Depressed, Hopeless 0  PHQ - 2 Score 0    Activities of Daily Living Screen/Functional Status Survey Is the patient deaf or have difficulty hearing?: No Does the patient have difficulty seeing, even when wearing glasses/contacts?: No Does the patient have difficulty concentrating, remembering, or making decisions?: Yes (sometime remembering) Does the patient have difficulty walking or climbing stairs?: No Does the patient have difficulty dressing or bathing?: No Does the patient have difficulty doing errands alone such as visiting a doctor's office or shopping?: No  Can patient draw a clock face showing 3:15 oclock, yes  Fall Risk Screen Fall Risk  11/09/2016 10/18/2014  Falls in the past year? No No    Gait Assessment: Normal gait observed yes  Advanced directives Does patient have a Health Care Power of Attorney?no Does patient have a Living Will? no  Past Medical History:  Diagnosis Date  . Anemia   . Arthritis   . ASHD (arteriosclerotic heart disease)   . Blood in stool    GI consult 2012, EGD and colonoscopy  . Eczema   . GERD (gastroesophageal reflux disease)   . H/O bone density study 07/09/2006   osteopenia; ([facility])  . Hemorrhoid    internal and external, general surgery consult 04/2011  . History of echocardiogram 02/17/2008   normal LV structure and function w/ EF >55%, trace MR, TR and PI  . History of nuclear stress test 02/2008   no significant ischemia, low risk scan, post stress  EF 68%  . Hyperlipidemia   . Hypertension   . Osteopenia     Past Surgical History:  Procedure Laterality Date  . ABDOMINAL HYSTERECTOMY    . COLONOSCOPY  2012, 2008   Dr. Loreta AveMann  . ESOPHAGOGASTRODUODENOSCOPY  2012, 2008   Dr. Loreta AveMann    Social History   Social History  . Marital status: Married    Spouse name: N/A  . Number of children: N/A  . Years of education: N/A   Occupational History  . [employer]     Retired    Social History Main Topics  . Smoking status: Never Smoker  . Smokeless tobacco: Never Used  . Alcohol use No  . Drug use: No  . Sexual activity: Not Currently   Other Topics Concern  . Not on file   Social History Narrative   Married second time.  Retired recently.     Family History  Problem Relation Age of Onset  . Heart disease Mother        Pacemaker  . Diabetes Mother   . Cancer Father        lung  . Diabetes Father   . Diabetes Sister   . Diabetes Brother   . Heart disease Brother        1 brother died MI age 65 yo  . Cancer Other        maternal side,non first degree breast cancer  . Stroke Neg Hx   . Hypertension Neg Hx   .  Hyperlipidemia Neg Hx      Current Outpatient Prescriptions:  .  aspirin 81 MG tablet, Take 81 mg by mouth daily., Disp: , Rfl:  .  atorvastatin (LIPITOR) 40 MG tablet, Take 1 tablet (40 mg total) by mouth daily., Disp: 90 tablet, Rfl: 3 .  bisoprolol-hydrochlorothiazide (ZIAC) 5-6.25 MG tablet, Take 2 tablets by mouth daily., Disp: 180 tablet, Rfl: 3 .  dexlansoprazole (DEXILANT) 60 MG capsule, Take 1 capsule (60 mg total) by mouth daily., Disp: 90 capsule, Rfl: 3 .  felodipine (PLENDIL) 10 MG 24 hr tablet, Take 1 tablet (10 mg total) by mouth daily., Disp: 90 tablet, Rfl: 3 .  FIBER PO, Take 500 mg by mouth daily., Disp: , Rfl:  .  IRON PO, Take 1,000 mg by mouth., Disp: , Rfl:  .  Multiple Vitamin (MULTIVITAMIN) capsule, Take 1 capsule by mouth daily.  , Disp: , Rfl:  .  Olopatadine HCl (PAZEO) 0.7 %  SOLN, Apply 1 drop to eye daily. Both Eyes, Disp: , Rfl:  .  Omega-3 Fatty Acids (FISH OIL PO), Take by mouth daily. 4 capsules daily, Disp: , Rfl:  .  PREVIDENT 5000 ENAMEL PROTECT 1.1-5 % PSTE, daily., Disp: , Rfl: 0 .  vitamin E 400 UNIT capsule, Take 400 Units by mouth daily., Disp: , Rfl:   Allergies  Allergen Reactions  . Penicillins   She does have hypertension and is doing well on her present blood pressure medication. She is taking atorvastatin and having no aches and pains. She does used Exelon for her reflux symptoms and uses this sparingly. She continues on Plendil as well. She also is taking various over-the-counter medications and having no difficulty with them. She does have a previous history of osteopenia and plans to follow-up with her gynecologist concerning DEXA scanning and get a Pap smear. Otherwise she has no particular concerns or complaints.  History reviewed: allergies, current medications, past family history, past medical history, past social history, past surgical history and problem list   Objective:      Biometrics BP 134/84   Pulse (!) 59   Ht 5' 4.5" (1.638 m)   Wt 200 lb (90.7 kg)   SpO2 95%   BMI 33.80 kg/m   Cognitive Testing  Alert? Yes  Normal Appearance?Yes  Oriented to person? Yes  Place? Yes   Time? Yes  Recall of three objects?  Yes  Can perform simple calculations? Yes  Displays appropriate judgment?Yes  Can read the correct time from a watch face?Yes  General appearance: alert, no distress, WD/WN, black  female  Nutritional Status: Inadequate calore intake? no Loss of muscle mass? no Loss of fat beneath skin? no Localized or general edema? no Diminished functional status? no  Other pertinent exam: HEENT: normocephalic, sclerae anicteric, TMs pearly, nares patent, no discharge or erythema, pharynx normal Oral cavity: MMM, no lesions Neck: supple, no lymphadenopathy, no thyromegaly, no masses Heart: RRR, normal S1, S2, no  murmurs Lungs: CTA bilaterally, no wheezes, rhonchi, or rales Abdomen: +bs, soft, non tender, non distended, no masses, no hepatomegaly, no splenomegaly Musculoskeletal: nontender, no swelling, no obvious deformity Extremities: no edema, no cyanosis, no clubbing Pulses: 2+ symmetric, upper and lower extremities, normal cap refill Neurological: alert, oriented x 3, CN2-12 intact, strength normal upper extremities and lower extremities, sensation normal throughout, DTRs 2+ throughout, no cerebellar signs, gait normal Psychiatric: normal affect, behavior normal, pleasant    Assessment:   Encounter Diagnoses  Name Primary?  . Routine general medical  examination at a health care facility Yes  . Essential hypertension, benign   . Gastroesophageal reflux disease without esophagitis   . Hyperlipidemia, unspecified hyperlipidemia type   . Osteopenia, unspecified location   . Need for vaccination against Streptococcus pneumoniae   Routine general medical examination at a health care facility - Plan: POCT Urinalysis DIP (Proadvantage Device), CBC with Differential/Platelet, Comprehensive metabolic panel, Lipid panel  Essential hypertension, benign - Plan: CBC with Differential/Platelet, Comprehensive metabolic panel, felodipine (PLENDIL) 10 MG 24 hr tablet, bisoprolol-hydrochlorothiazide (ZIAC) 5-6.25 MG tablet  Gastroesophageal reflux disease without esophagitis - Plan: dexlansoprazole (DEXILANT) 60 MG capsule  Hyperlipidemia, unspecified hyperlipidemia type - Plan: Lipid panel, atorvastatin (LIPITOR) 40 MG tablet  Osteopenia, unspecified location - Plan: CBC with Differential/Platelet, Comprehensive metabolic panel, VITAMIN D 25 Hydroxy (Vit-D Deficiency, Fractures)  Need for vaccination against Streptococcus pneumoniae - Plan: Pneumococcal conjugate vaccine 13-valent     Plan:   A preventative services visit was completed today.  During the course of the visit today, we discussed and  counseled about appropriate screening and preventive services.  A health risk assessment was established today that included a review of current medications, allergies, social history, family history, medical and preventative health history, biometrics, and preventative screenings to identify potential safety concerns or impairments.  A personalized plan was printed today for your records and use.   Personalized health advice and education was given today to reduce health risks and promote self management and wellness.  Information regarding end of life planning was discussed today.  Conditions/risks identified:  Fall risk? Exercise - Not regular Nutritions - adequate  Recommendations:  I recommend a yearly ophthalmology/optometry visit for glaucoma screening and eye checkup  I recommended a yearly dental visit for hygiene and checkup  Advanced directives - discussed nature and purpose of Advanced Directives, encouraged them to complete them if they have not done so and/or encouraged them to get [place] a copy if they have done this already. Immunizations: I recommended a yearly influenza vaccine, typically in September when the vaccine is usually available Is the Pneumococcal vaccine up to date: yes. Is the Shingles vaccine up to date: no.   Is the Td/Tdap vaccine up to date: no.   Medicare Attestation A preventative services visit was completed today.  During the course of the visit the patient was educated and counseled about appropriate screening and preventive services.  A health risk assessment was established with the patient that included a review of current medications, allergies, social history, family history, medical and preventative health history, biometrics, and preventative screenings to identify potential safety concerns or impairments.  A personalized plan was printed today for the patient's records and use.   Personalized health advice and education was given today to reduce  health risks and promote self management and wellness.  Information regarding end of life planning was discussed today.  Carollee Herter, MD   11/09/2016

## 2016-11-09 NOTE — Patient Instructions (Addendum)
20 minutes daily of something physical. Move your body  [name] [name] , Thank you for taking time to come for your Medicare Wellness Visit. I appreciate your ongoing commitment to your health goals. Please review the following plan we discussed and let me know if I can assist you in the future.   These are the goals we discussed: Increased physical activity  This is a list of the screening recommended for you and due dates:  Health Maintenance  Topic Date Due  . HIV Screening  07/[birth year]/1966  . Pap Smear  10/06/2014  . Mammogram  11/25/2014  . Pneumonia vaccines (1 of 2 - PCV13) 07/22/2016  . Flu Shot  12/05/2016  . Colon Cancer Screening  03/08/2021  . Tetanus Vaccine  10/15/2021  . DEXA scan (bone density measurement)  Completed  .  Hepatitis C: One time screening is recommended by Center for Disease Control  (CDC) for  adults born from [birth year] through 1964.   Completed

## 2016-11-10 LAB — VITAMIN D 25 HYDROXY (VIT D DEFICIENCY, FRACTURES): Vit D, 25-Hydroxy: 36 ng/mL (ref 30–100)

## 2016-11-20 LAB — HM MAMMOGRAPHY

## 2017-01-11 ENCOUNTER — Telehealth: Payer: Self-pay | Admitting: Family Medicine

## 2017-01-11 NOTE — Telephone Encounter (Signed)
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

## 2017-01-11 NOTE — Telephone Encounter (Signed)
write the letter

## 2017-01-14 ENCOUNTER — Encounter: Payer: Self-pay | Admitting: Family Medicine

## 2017-01-15 NOTE — Telephone Encounter (Signed)
Letter typed and signed by Dr. Susann GivensLalonde, called all pt's numbers  and unable to leave message

## 2017-01-17 NOTE — Telephone Encounter (Signed)
Left message with husband.

## 2017-04-01 ENCOUNTER — Encounter: Payer: Self-pay | Admitting: Family Medicine

## 2017-04-01 ENCOUNTER — Ambulatory Visit: Payer: Medicare Other | Admitting: Family Medicine

## 2017-04-01 VITALS — BP 140/70 | HR 79 | Resp 16 | Wt 206.4 lb

## 2017-04-01 DIAGNOSIS — M1711 Unilateral primary osteoarthritis, right knee: Secondary | ICD-10-CM | POA: Diagnosis not present

## 2017-04-01 MED ORDER — TRIAMCINOLONE ACETONIDE 40 MG/ML IJ SUSP
40.0000 mg | Freq: Once | INTRAMUSCULAR | Status: AC
  Administered 2017-04-01: 40 mg via INTRA_ARTICULAR

## 2017-04-01 MED ORDER — LIDOCAINE HCL (PF) 2 % IJ SOLN
3.0000 mL | Freq: Once | INTRAMUSCULAR | Status: AC
  Administered 2017-04-01: 3 mL

## 2017-04-01 NOTE — Addendum Note (Signed)
Addended by: Minette HeadlandBENFIELD, Johnatan Baskette L on: 04/01/2017 04:13 PM   Modules accepted: Orders

## 2017-04-01 NOTE — Progress Notes (Signed)
   Subjective:    Patient ID: [name] BaltimoreEster J [name], female    DOB: [birth year], 66 y.o.   MRN: [id number]  HPI She complains of a 5150-month history of difficulty with right knee pain.  She states that sometimes it gives way but she has had no difficulty with popping, locking, swelling.  Review of the record indicates she had difficulty with this approximately 1 year ago.  X-rays at that time did show minimal arthritis.  She was given an intra-articular injection which did help with her symptoms.   Review of Systems     Objective:   Physical Exam Alert and in no distress.  No palpable tenderness.  The joint is not red hot or tender.  Ligaments intact.  Negative McMurray's testing.      Assessment & Plan:  Arthritis of right knee  I discussed the options with her and she would like another injection.  The lateral aspect of the right knee was prepped with Betadine.  The joint was identified and 40 mg of Kenalog and 3 cc of Xylocaine was injected into the joint without difficulty.  Within a few minutes she did obtain relief of her symptoms. I discussed follow-up with her and recommended continued conservative care however if she has a return of her symptoms are not too frequent the timeframe, will refer for Synvisc etc.

## 2017-04-16 ENCOUNTER — Telehealth: Payer: Self-pay | Admitting: Family Medicine

## 2017-04-16 NOTE — Telephone Encounter (Signed)
[facility] is stating Dexilant 60 mg delayed release cap is approved for a tier cost sharing exception through 05/06/18 under Medicare Part D

## 2017-04-16 NOTE — Telephone Encounter (Signed)
Initiated P.A. Dexilant  

## 2017-04-20 NOTE — Telephone Encounter (Signed)
P.A. Approved til 05/06/18, called pt and informed

## 2017-12-06 ENCOUNTER — Telehealth: Payer: Self-pay | Admitting: Family Medicine

## 2017-12-06 NOTE — Telephone Encounter (Signed)
Received requested mammogram from [facility]. Sending back for review.

## 2017-12-13 ENCOUNTER — Ambulatory Visit: Payer: Medicare Other | Admitting: Family Medicine

## 2017-12-13 ENCOUNTER — Encounter: Payer: Self-pay | Admitting: Family Medicine

## 2017-12-13 VITALS — BP 126/78 | HR 54 | Temp 98.0°F | Ht 65.0 in | Wt 199.4 lb

## 2017-12-13 DIAGNOSIS — Z0000 Encounter for general adult medical examination without abnormal findings: Secondary | ICD-10-CM | POA: Diagnosis not present

## 2017-12-13 DIAGNOSIS — I10 Essential (primary) hypertension: Secondary | ICD-10-CM | POA: Diagnosis not present

## 2017-12-13 DIAGNOSIS — E785 Hyperlipidemia, unspecified: Secondary | ICD-10-CM

## 2017-12-13 DIAGNOSIS — K219 Gastro-esophageal reflux disease without esophagitis: Secondary | ICD-10-CM

## 2017-12-13 DIAGNOSIS — M1711 Unilateral primary osteoarthritis, right knee: Secondary | ICD-10-CM

## 2017-12-13 DIAGNOSIS — Z86010 Personal history of colonic polyps: Secondary | ICD-10-CM

## 2017-12-13 DIAGNOSIS — M8580 Other specified disorders of bone density and structure, unspecified site: Secondary | ICD-10-CM | POA: Diagnosis not present

## 2017-12-13 DIAGNOSIS — Z7189 Other specified counseling: Secondary | ICD-10-CM

## 2017-12-13 DIAGNOSIS — Z860101 Personal history of adenomatous and serrated colon polyps: Secondary | ICD-10-CM

## 2017-12-13 LAB — COMPREHENSIVE METABOLIC PANEL
ALK PHOS: 75 IU/L (ref 39–117)
ALT: 9 IU/L (ref 0–32)
AST: 17 IU/L (ref 0–40)
Albumin/Globulin Ratio: 2.3 — ABNORMAL HIGH (ref 1.2–2.2)
Albumin: 4.6 g/dL (ref 3.6–4.8)
BUN / CREAT RATIO: 13 (ref 12–28)
BUN: 13 mg/dL (ref 8–27)
Bilirubin Total: 0.5 mg/dL (ref 0.0–1.2)
CO2: 24 mmol/L (ref 20–29)
CREATININE: 1 mg/dL (ref 0.57–1.00)
Calcium: 9.8 mg/dL (ref 8.7–10.3)
Chloride: 105 mmol/L (ref 96–106)
GFR calc Af Amer: 68 mL/min/{1.73_m2} (ref >59)
GFR calc non Af Amer: 59 mL/min/{1.73_m2} — ABNORMAL LOW (ref >59)
GLUCOSE: 96 mg/dL (ref 65–99)
Globulin, Total: 2 g/dL (ref 1.5–4.5)
Potassium: 4.3 mmol/L (ref 3.5–5.2)
Sodium: 143 mmol/L (ref 134–144)
Total Protein: 6.6 g/dL (ref 6.0–8.5)

## 2017-12-13 LAB — CBC WITH DIFFERENTIAL/PLATELET
BASOS ABS: 0 10*3/uL (ref 0.0–0.2)
Basos: 1 %
EOS (ABSOLUTE): 0.3 10*3/uL (ref 0.0–0.4)
Eos: 6 %
Hematocrit: 38.2 % (ref 34.0–46.6)
Hemoglobin: 12.7 g/dL (ref 11.1–15.9)
Immature Grans (Abs): 0 10*3/uL (ref 0.0–0.1)
Immature Granulocytes: 0 %
LYMPHS ABS: 1.6 10*3/uL (ref 0.7–3.1)
Lymphs: 36 %
MCH: 29.9 pg (ref 26.6–33.0)
MCHC: 33.2 g/dL (ref 31.5–35.7)
MCV: 90 fL (ref 79–97)
MONOCYTES: 10 %
MONOS ABS: 0.5 10*3/uL (ref 0.1–0.9)
NEUTROS ABS: 2.1 10*3/uL (ref 1.4–7.0)
Neutrophils: 47 %
PLATELETS: 235 10*3/uL (ref 150–450)
RBC: 4.25 x10E6/uL (ref 3.77–5.28)
RDW: 14.3 % (ref 12.3–15.4)
WBC: 4.4 10*3/uL (ref 3.4–10.8)

## 2017-12-13 LAB — LIPID PANEL
CHOLESTEROL TOTAL: 190 mg/dL (ref 100–199)
Chol/HDL Ratio: 2.8 ratio (ref 0.0–4.4)
HDL: 68 mg/dL (ref >39)
LDL CALC: 105 mg/dL — AB (ref 0–99)
TRIGLYCERIDES: 85 mg/dL (ref 0–149)
VLDL CHOLESTEROL CAL: 17 mg/dL (ref 5–40)

## 2017-12-13 MED ORDER — BISOPROLOL-HYDROCHLOROTHIAZIDE 5-6.25 MG PO TABS: 2.0000 | ORAL_TABLET | Freq: Every day | ORAL | 3 refills | Status: DC

## 2017-12-13 MED ORDER — ATORVASTATIN CALCIUM 40 MG PO TABS: 40.0000 mg | ORAL_TABLET | Freq: Every day | ORAL | 3 refills | Status: DC

## 2017-12-13 MED ORDER — FELODIPINE ER 10 MG PO TB24: 10.0000 mg | ORAL_TABLET | Freq: Every day | ORAL | 3 refills | Status: DC

## 2017-12-13 MED ORDER — DEXLANSOPRAZOLE 60 MG PO CPDR: 1.0000 | DELAYED_RELEASE_CAPSULE | Freq: Every day | ORAL | 3 refills | Status: DC

## 2017-12-13 NOTE — Patient Instructions (Signed)
Call hospice [phone] 

## 2017-12-13 NOTE — Progress Notes (Signed)
[name] [name] is a 67 y.o. female who presents for annual wellness visit complete exam and follow-up on chronic medical conditions.  She does have some difficulty with arthritis but seems to be handling that fairly well.  He continues on her blood pressure medication without problems.  She does follow-up regularly with her cardiologist.  Her reflux is causing very little difficulty.  She does have a history of osteopenia.  She continues on Lipitor and is having no difficulty with that.  She is also using omega-3 product.  Does have a history of colonic polyps and has seen Dr. Loreta AveMann for routine follow-up on this.  Her husband died approximately 1 month ago.  She is still grieving over this.  So far she has not now involved in any kind of counseling for that.  Immunizations and Health Maintenance Immunization History  Administered Date(s) Administered  . Influenza Split 02/02/2015, 12/17/2015  . Influenza, High Dose Seasonal PF 01/08/2017  . Pneumococcal Conjugate-13 11/09/2016  . Pneumococcal Polysaccharide-23 03/11/2008  . Td 12/31/1995  . Tdap 10/16/2011  . Zoster 10/17/2011  . Zoster Recombinat (Shingrix) 11/09/2016, 01/08/2017   Health Maintenance Due  Topic Date Due  . PNA vac Low Risk Adult (2 of 2 - PPSV23) 11/09/2017  . INFLUENZA VACCINE  12/05/2017    Last Pap smear: had obgyn appt 12-05-17 Last mammogram:12-05-2017 Last colonoscopy: three ago Last DEXA: two years ago Dentist: 12-11-2017 Ophtho: 11-22-17 Exercise: walking 15-20 min line dance   Other doctors caring for patient include:McComb.  Advanced directives:yes    Depression screen:  See questionnaire below.  [facility] 12/13/2017 11/09/2016 10/18/2014 10/15/2012  Decreased Interest 0 0 0 0  Down, Depressed, Hopeless 0 0 0 0  PHQ - 2 Score 0 0 0 0    Fall Risk Screen: see questionnaire below. Fall Risk  12/13/2017 11/09/2016 10/18/2014  Falls in the past year? No No No    ADL screen:  See questionnaire  below Functional Status Survey: Is the patient deaf or have difficulty hearing?: No Does the patient have difficulty seeing, even when wearing glasses/contacts?: No Does the patient have difficulty concentrating, remembering, or making decisions?: No Does the patient have difficulty walking or climbing stairs?: No Does the patient have difficulty dressing or bathing?: No Does the patient have difficulty doing errands alone such as visiting a doctor's office or shopping?: No   Review of Systems Constitutional: -, -unexpected weight change, -anorexia, -fatigue Allergy: -sneezing, -itching, -congestion Dermatology: denies changing moles, rash, lumps ENT: -runny nose, -ear pain, -sore throat,  Cardiology:  -chest pain, -palpitations, -orthopnea, Respiratory: -cough, -shortness of breath, -dyspnea on exertion, -wheezing,  Gastroenterology: -abdominal pain, -nausea, -vomiting, -diarrhea, -constipation, -dysphagia Hematology: -bleeding or bruising problems Musculoskeletal: -arthralgias, -myalgias, -joint swelling, -back pain, - Ophthalmology: -vision changes,  Urology: -dysuria, -difficulty urinating,  -urinary frequency, -urgency, incontinence Neurology: -, -numbness, , -memory loss, -falls, -dizziness    PHYSICAL EXAM:   General Appearance: Alert, cooperative, no distress, appears stated age Head: Normocephalic, without obvious abnormality, atraumatic Eyes: PERRL, conjunctiva/corneas clear, EOM's intact, fundi benign Ears: Normal TM's and external ear canals Nose: Nares normal, mucosa normal, no drainage or sinus tenderness Throat: Lips, mucosa, and tongue normal; teeth and gums normal Neck: Supple, no lymphadenopathy;  thyroid:  no enlargement/tenderness/nodules; no carotid bruit or JVD Lungs: Clear to auscultation bilaterally without wheezes, rales or ronchi; respirations unlabored Heart: Regular rate and rhythm, S1 and S2 normal, no murmur, rubor gallop Abdomen: Soft, non-tender,  nondistended, normoactive bowel  sounds,  no masses, no hepatosplenomegaly Extremities: No clubbing, cyanosis or edema Pulses: 2+ and symmetric all extremities Skin:  Skin color, texture, turgor normal, no rashes or lesions Lymph nodes: Cervical, supraclavicular, and axillary nodes normal Neurologic:  CNII-XII intact, normal strength, sensation and gait; reflexes 2+ and symmetric throughout Psych: Normal mood, affect, hygiene and grooming.  ASSESSMENT/PLAN: Routine general medical examination at a health care facility - Plan: CBC with Differential/Platelet, Comprehensive metabolic panel, Lipid panel  Arthritis of right knee  Essential hypertension, benign - Plan: felodipine (PLENDIL) 10 MG 24 hr tablet, bisoprolol-hydrochlorothiazide (ZIAC) 5-6.25 MG tablet, CBC with Differential/Platelet, Comprehensive metabolic panel  Gastroesophageal reflux disease without esophagitis - Plan: dexlansoprazole (DEXILANT) 60 MG capsule  Osteopenia, unspecified location  Hyperlipidemia, unspecified hyperlipidemia type - Plan: atorvastatin (LIPITOR) 40 MG tablet, Lipid panel  Bereavement counseling  Hx of adenomatous colonic polyps She will continue on her present medication regimen.  Discussed the osteopenia with her in regard to vitamin D and calcium.  Encouraged her to get involved in bereavement counseling through Hospice.  Discussed follow-up concerning gynecologic issues and since she has had a hysterectomy the need for GYN evaluation at this [name] is diminished.     Medicare Attestation I have personally reviewed: The patient's medical and social history Their use of alcohol, tobacco or illicit drugs Their current medications and supplements The patient's functional ability including ADLs,fall risks, home safety risks, cognitive, and hearing and visual impairment Diet and physical activities Evidence for depression or mood disorders  The patient's weight, height, and BMI have been recorded  in the chart.  I have made referrals, counseling, and provided education to the patient based on review of the above and I have provided the patient with a written personalized care plan for preventive services.     Sharlot Gowda, MD   12/13/2017

## 2018-02-04 ENCOUNTER — Ambulatory Visit: Payer: Medicare Other | Admitting: Family Medicine

## 2018-02-04 ENCOUNTER — Encounter: Payer: Self-pay | Admitting: Family Medicine

## 2018-02-04 VITALS — BP 142/90 | HR 57 | Temp 98.3°F | Wt 202.4 lb

## 2018-02-04 DIAGNOSIS — M1711 Unilateral primary osteoarthritis, right knee: Secondary | ICD-10-CM | POA: Diagnosis not present

## 2018-02-04 DIAGNOSIS — Z23 Encounter for immunization: Secondary | ICD-10-CM | POA: Diagnosis not present

## 2018-02-04 NOTE — Progress Notes (Signed)
   Subjective:    Patient ID: [name] [name], female    DOB: June 05, 1950, 67 y.o.   MRN: [id number]  HPI She is again having difficulty with right knee pain.  He has a previous history of arthritis in that and has responded fairly well to to previous injections.  The last one was a little less than a year ago.  She notes increased pain over the last several months but no popping, locking, grinding.   Review of Systems     Objective:   Physical Exam Alert and in no distress.  Full motion of the right knee.  No effusion noted.  No tenderness palpation over the joint lines.  McMurray's testing was uncomfortable.      Assessment & Plan:  Arthritis of right knee  Need for influenza vaccination - Plan: Flu vaccine HIGH DOSE PF (Fluzone High dose) I again discussed the possible options.  Since she did respond very nicely to the previous injection, I will give her another one.  Explained the fact that if the time shortness between injections, we would need to consider a different injectable as well as x-rays and referral.  She expressed understanding of this. The knee was prepped laterally with Betadine.  The joint was identified.  40 mg of Depo-Medrol and 3 cc of Xylocaine was injected into the joint without difficulty.  She did obtain relatively quick relief of her symptoms.

## 2018-02-04 NOTE — Patient Instructions (Signed)
For initial pain relief always use Tylenol first and if that does not work then you can use Advil or Aleve.  The dosing of the Aleve would be 2 pills twice per day as needed

## 2018-02-19 ENCOUNTER — Other Ambulatory Visit: Payer: Self-pay | Admitting: Family Medicine

## 2018-02-19 DIAGNOSIS — I10 Essential (primary) hypertension: Secondary | ICD-10-CM

## 2018-03-20 ENCOUNTER — Ambulatory Visit: Payer: Medicare Other | Admitting: Family Medicine

## 2018-03-20 VITALS — BP 138/84 | HR 60 | Temp 98.2°F | Wt 208.2 lb

## 2018-03-20 DIAGNOSIS — N3281 Overactive bladder: Secondary | ICD-10-CM | POA: Diagnosis not present

## 2018-03-20 LAB — POCT URINALYSIS DIP (PROADVANTAGE DEVICE)
BILIRUBIN UA: NEGATIVE mg/dL
Bilirubin, UA: NEGATIVE
Blood, UA: NEGATIVE
Glucose, UA: NEGATIVE mg/dL
LEUKOCYTES UA: NEGATIVE
Nitrite, UA: NEGATIVE
PH UA: 7 (ref 5.0–8.0)
PROTEIN UA: NEGATIVE mg/dL
Specific Gravity, Urine: 1.015
Urobilinogen, Ur: 3.5

## 2018-03-20 MED ORDER — SOLIFENACIN SUCCINATE 5 MG PO TABS: 5.0000 mg | ORAL_TABLET | Freq: Every day | ORAL | 5 refills | Status: DC

## 2018-03-20 NOTE — Addendum Note (Signed)
Addended by: Renelda LomaHENRY, Twain Stenseth on: 03/20/2018 05:10 PM   Modules accepted: Orders

## 2018-03-20 NOTE — Progress Notes (Signed)
   Subjective:    Patient ID: [name] [name], female    DOB: 11-26-1950, 67 y.o.   MRN: [id number]  HPI She complains of a several month history of urinary urgency and frequency.  Also notes difficulty with sneezing and coughing.  She was using a pad before and is now having to use pull-ups.  No dysuria, fever, chills, abdominal pain.   Review of Systems     Objective:   Physical Exam Alert and in no distress.  Urine dipstick was negative.       Assessment & Plan:  OAB (overactive bladder) - Plan: solifenacin (VESICARE) 5 MG tablet I discussed the diagnosis and treatment with her.  Explained possible side effects of blurred vision,etc. she will let me know how this works.

## 2018-04-07 ENCOUNTER — Ambulatory Visit: Payer: Medicare Other | Admitting: Family Medicine

## 2018-04-07 ENCOUNTER — Encounter: Payer: Self-pay | Admitting: Family Medicine

## 2018-04-07 VITALS — BP 162/94 | HR 60 | Temp 98.2°F | Wt 210.8 lb

## 2018-04-07 DIAGNOSIS — J4520 Mild intermittent asthma, uncomplicated: Secondary | ICD-10-CM

## 2018-04-07 MED ORDER — CLARITHROMYCIN 500 MG PO TABS: 500.0000 mg | ORAL_TABLET | Freq: Two times a day (BID) | ORAL | 0 refills | Status: DC

## 2018-04-07 MED ORDER — FLUCONAZOLE 150 MG PO TABS
150.0000 mg | ORAL_TABLET | Freq: Once | ORAL | 0 refills | Status: AC
Start: 2018-04-07 — End: 2018-04-07

## 2018-04-07 MED ORDER — ALBUTEROL SULFATE HFA 108 (90 BASE) MCG/ACT IN AERS
2.0000 | INHALATION_SPRAY | Freq: Four times a day (QID) | RESPIRATORY_TRACT | 2 refills | Status: DC | PRN
Start: 2018-04-07 — End: 2018-04-29

## 2018-04-07 NOTE — Patient Instructions (Signed)
You can take 2 Aleve twice per day for the fever aches and pains.

## 2018-04-07 NOTE — Progress Notes (Signed)
   Subjective:    Patient ID: [name] [name], female    DOB: 21-Nov-1950, 67 y.o.   MRN: [id number]  HPI She complains of a one-week history started with slight fever, rhinorrhea and chills.  Is been intermittent until 2 days ago when the shortness of breath and wheezing got worse.  She does have underlying allergies.  No sore throat, earache.   Review of Systems     Objective:   Physical Exam Alert and in no distress. Tympanic membranes and canals are normal. Pharyngeal area is normal. Neck is supple without adenopathy or thyromegaly. Cardiac exam shows a regular sinus rhythm without murmurs or gallops. Lungs are clear to auscultation. She also states that she gets a yeast infection when she is put on an antibiotic.       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

## 2018-04-28 ENCOUNTER — Encounter: Payer: Self-pay | Admitting: Medical

## 2018-04-28 ENCOUNTER — Ambulatory Visit
Admission: RE | Admit: 2018-04-28 | Discharge: 2018-04-28 | Disposition: A | Discharge status: Home / Self Care | Payer: Medicare Other | Source: Ambulatory Visit | Attending: Medical | Admitting: Medical

## 2018-04-28 ENCOUNTER — Ambulatory Visit: Payer: Medicare Other | Admitting: Medical

## 2018-04-28 VITALS — BP 128/84 | HR 56 | Temp 98.0°F | Wt 214.2 lb

## 2018-04-28 DIAGNOSIS — R05 Cough: Secondary | ICD-10-CM

## 2018-04-28 DIAGNOSIS — E785 Hyperlipidemia, unspecified: Secondary | ICD-10-CM

## 2018-04-28 DIAGNOSIS — R0602 Shortness of breath: Secondary | ICD-10-CM

## 2018-04-28 DIAGNOSIS — I10 Essential (primary) hypertension: Secondary | ICD-10-CM | POA: Diagnosis not present

## 2018-04-28 DIAGNOSIS — R5383 Other fatigue: Secondary | ICD-10-CM

## 2018-04-28 DIAGNOSIS — R059 Cough, unspecified: Secondary | ICD-10-CM

## 2018-04-28 DIAGNOSIS — R0989 Other specified symptoms and signs involving the circulatory and respiratory systems: Secondary | ICD-10-CM

## 2018-04-28 LAB — CBC WITH DIFFERENTIAL/PLATELET
BASOS: 1 %
Basophils Absolute: 0.1 10*3/uL (ref 0.0–0.2)
EOS (ABSOLUTE): 0.7 10*3/uL — ABNORMAL HIGH (ref 0.0–0.4)
Eos: 11 %
HEMATOCRIT: 38.7 % (ref 34.0–46.6)
Hemoglobin: 13.2 g/dL (ref 11.1–15.9)
LYMPHS: 44 %
Lymphocytes Absolute: 3 10*3/uL (ref 0.7–3.1)
MCH: 30.1 pg (ref 26.6–33.0)
MCHC: 34.1 g/dL (ref 31.5–35.7)
MCV: 88 fL (ref 79–97)
Monocytes Absolute: 0.8 10*3/uL (ref 0.1–0.9)
Monocytes: 12 %
NEUTROS ABS: 2.2 10*3/uL (ref 1.4–7.0)
Neutrophils: 32 %
Platelets: 220 10*3/uL (ref 150–450)
RBC: 4.38 x10E6/uL (ref 3.77–5.28)
RDW: 14.5 % (ref 12.3–15.4)
WBC: 6.8 10*3/uL (ref 3.4–10.8)

## 2018-04-28 LAB — BASIC METABOLIC PANEL
BUN/Creatinine Ratio: 15 (ref 12–28)
BUN: 15 mg/dL (ref 8–27)
CO2: 30 mmol/L — ABNORMAL HIGH (ref 20–29)
Calcium: 9.7 mg/dL (ref 8.7–10.3)
Chloride: 102 mmol/L (ref 96–106)
Creatinine, Ser: 1.03 mg/dL — ABNORMAL HIGH (ref 0.57–1.00)
GFR calc non Af Amer: 57 mL/min/{1.73_m2} — ABNORMAL LOW (ref >59)
GFR, EST AFRICAN AMERICAN: 65 mL/min/{1.73_m2} (ref >59)
Glucose: 106 mg/dL — ABNORMAL HIGH (ref 65–99)
Potassium: 4.3 mmol/L (ref 3.5–5.2)
Sodium: 138 mmol/L (ref 134–144)

## 2018-04-28 NOTE — Progress Notes (Signed)
Subjective: Chief Complaint  Patient presents with  . Other     Bronchitis still not any better abx is completeleft side chest pain . Headache and both ear issues.    Here for bronchitis.  Saw Dr. Susann GivensLalonde 04/07/18 for same symptoms, was advised antibiotics and breathign treatment.  Still having problems .  Wheezing and SOB had improved, overall has improved since last visit but has some lingering and some new symtpoms including runny nose, sneezing, sore throat, irritated throat, chest discovmfort.   No worse chest pain with activity.  Is SOB.  Has general left chest discomfort, cougihng a lot.   Using nothing for current symptoms.  No other aggravating or relieving factors. No other complaint.   Past Medical History:  Diagnosis Date  . Anemia   . Arthritis   . ASHD (arteriosclerotic heart disease)   . Blood in stool    GI consult 2012, EGD and colonoscopy  . Eczema   . GERD (gastroesophageal reflux disease)   . H/O bone density study 07/09/2006   osteopenia; [facility]  . Hemorrhoid    internal and external, general surgery consult 04/2011  . History of echocardiogram 02/17/2008   normal LV structure and function w/ EF >55%, trace MR, TR and PI  . History of nuclear stress test 02/2008   no significant ischemia, low risk scan, post stress EF 68%  . Hyperlipidemia   . Hypertension   . Osteopenia    Current Outpatient Medications on File Prior to Visit  Medication Sig Dispense Refill  . albuterol (PROVENTIL HFA;VENTOLIN HFA) 108 (90 Base) MCG/ACT inhaler Inhale 2 puffs into the lungs every 6 (six) hours as needed for wheezing or shortness of breath. 1 Inhaler 2  . aspirin 81 MG tablet Take 81 mg by mouth daily.    Marland Kitchen. atorvastatin (LIPITOR) 40 MG tablet Take 1 tablet (40 mg total) by mouth daily. 90 tablet 3  . bisoprolol-hydrochlorothiazide (ZIAC) 5-6.25 MG tablet TAKE 2 TABLETS BY MOUTH ONCE DAILY 180 tablet 1  . dexlansoprazole (DEXILANT) 60 MG capsule Take 1 capsule (60 mg  total) by mouth daily. 90 capsule 3  . felodipine (PLENDIL) 10 MG 24 hr tablet Take 1 tablet (10 mg total) by mouth daily. 90 tablet 3  . FIBER PO Take 500 mg by mouth daily.    . IRON PO Take 1,000 mg by mouth.    . Multiple Vitamin (MULTIVITAMIN) capsule Take 1 capsule by mouth daily.      . Olopatadine HCl (PAZEO) 0.7 % SOLN Apply 1 drop to eye daily. Both Eyes    . Omega-3 Fatty Acids (FISH OIL PO) Take by mouth daily. 4 capsules daily    . PREVIDENT 5000 ENAMEL PROTECT 1.1-5 % PSTE daily.  0  . solifenacin (VESICARE) 5 MG tablet Take 1 tablet (5 mg total) by mouth daily. 30 tablet 5  . vitamin E 400 UNIT capsule Take 400 Units by mouth daily.    . clarithromycin (BIAXIN) 500 MG tablet Take 1 tablet (500 mg total) by mouth 2 (two) times daily. (Patient not taking: Reported on 04/28/2018) 20 tablet 0  . Nutritional Supplements (VITAMIN D BOOSTER PO) Take 1 capsule by mouth.     No current facility-administered medications on file prior to visit.    ROS as in subjective   Objective: BP 128/84 (BP Location: Left Arm, Patient Position: Sitting)   Pulse (!) 56   Temp 98 F (36.7 C)   Wt 214 lb 3.2 oz (  97.2 kg)   SpO2 96%   BMI 35.64 kg/m   Wt Readings from Last 3 Encounters:  04/28/18 214 lb 3.2 oz (97.2 kg)  04/07/18 210 lb 12.8 oz (95.6 kg)  03/20/18 208 lb 3.2 oz (94.4 kg)   General: Well-developed well-nourished no acute distress, pleasant African-American female Skin warm dry Lungs seem clear, no wheezing or rales or rhonchi Heart: RRR, normal S1, normal S2, but there is a 1 out of 6 to 2 out of 6 systolic murmur heard best in the upper sternal borders There is a prominent jugular venous distention of the right neck There is no obvious lower extremity edema or edema in general Pulses 1+ upper and lower HEENT unremarkable Neck otherwise unremarkable, no mass no lymphadenopathy  EKG done due to shortness of breath, ventricular rate 54 bpm, PR interval 190 ms, QRS duration  84 ms, QTC 422 ms, Axis 19 degrees, sinus bradycardia, minimal voltage criteria for LVH, bradycardia compared to EKG 2017   Assessment: Encounter Diagnoses  Name Primary?  . SOB (shortness of breath) Yes  . Essential hypertension, benign   . Hyperlipidemia, unspecified hyperlipidemia type   . Fatigue, unspecified type   . Cough   . JVD (jugular venous distension)      Plan: We discussed the symptoms and exam findings.  Labs and chest x-ray today.  She can use Coricidin HBP over-the-counter, rest, I will call back in a few hours with the stat lab results.  [name] [name] was seen today for other.  Diagnoses and all orders for this visit:  SOB (shortness of breath) -     Cancel: Brain natriuretic peptide -     Basic metabolic panel -     CBC with Differential/Platelet -     DG Chest 2 View; Future -     EKG 12-Lead -     Brain natriuretic peptide  Essential hypertension, benign -     Cancel: Brain natriuretic peptide -     Basic metabolic panel -     CBC with Differential/Platelet -     DG Chest 2 View; Future -     EKG 12-Lead -     Brain natriuretic peptide  Hyperlipidemia, unspecified hyperlipidemia type -     Cancel: Brain natriuretic peptide -     Basic metabolic panel -     CBC with Differential/Platelet -     DG Chest 2 View; Future -     EKG 12-Lead  Fatigue, unspecified type -     Cancel: Brain natriuretic peptide -     Basic metabolic panel -     CBC with Differential/Platelet -     DG Chest 2 View; Future -     EKG 12-Lead -     Brain natriuretic peptide  Cough -     Cancel: Brain natriuretic peptide -     Basic metabolic panel -     CBC with Differential/Platelet -     DG Chest 2 View; Future -     EKG 12-Lead  JVD (jugular venous distension)  Other orders -     Cancel: TSH -     Cancel: Comprehensive metabolic panel -     Cancel: CBC with Differential -     Cancel: Lipid Panel

## 2018-04-29 ENCOUNTER — Other Ambulatory Visit: Payer: Self-pay | Admitting: Medical

## 2018-04-29 DIAGNOSIS — J4520 Mild intermittent asthma, uncomplicated: Secondary | ICD-10-CM

## 2018-04-29 LAB — BRAIN NATRIURETIC PEPTIDE: BNP: 25.7 pg/mL (ref 0.0–100.0)

## 2018-04-29 MED ORDER — ALBUTEROL SULFATE HFA 108 (90 BASE) MCG/ACT IN AERS: 2.0000 | INHALATION_SPRAY | Freq: Four times a day (QID) | RESPIRATORY_TRACT | 0 refills | Status: DC | PRN

## 2018-04-29 MED ORDER — LEVOFLOXACIN 500 MG PO TABS: 500.0000 mg | ORAL_TABLET | Freq: Every day | ORAL | 0 refills | Status: AC

## 2018-04-29 MED ORDER — FLUCONAZOLE 150 MG PO TABS: 150.0000 mg | ORAL_TABLET | Freq: Every day | ORAL | 0 refills | Status: DC

## 2018-05-30 ENCOUNTER — Encounter: Payer: Self-pay | Admitting: Family Medicine

## 2018-05-30 ENCOUNTER — Telehealth: Payer: Self-pay | Admitting: Family Medicine

## 2018-05-30 ENCOUNTER — Ambulatory Visit: Payer: Medicare Other | Admitting: Family Medicine

## 2018-05-30 VITALS — BP 128/86 | HR 60 | Temp 98.1°F | Wt 211.4 lb

## 2018-05-30 DIAGNOSIS — M25511 Pain in right shoulder: Secondary | ICD-10-CM

## 2018-05-30 DIAGNOSIS — J4520 Mild intermittent asthma, uncomplicated: Secondary | ICD-10-CM | POA: Diagnosis not present

## 2018-05-30 DIAGNOSIS — M25512 Pain in left shoulder: Secondary | ICD-10-CM

## 2018-05-30 DIAGNOSIS — M1711 Unilateral primary osteoarthritis, right knee: Secondary | ICD-10-CM | POA: Diagnosis not present

## 2018-05-30 MED ORDER — LEVOFLOXACIN 500 MG PO TABS: 500.0000 mg | ORAL_TABLET | Freq: Every day | ORAL | 0 refills | Status: DC

## 2018-05-30 NOTE — Telephone Encounter (Signed)
Pt called and stated that the antibiotic that was prescribed to her is making her have the same symptoms she was in the office for today. She read the side effects after she started feeling bad again. Pt stated that she will stop taking the medication until advised otherwise.

## 2018-05-30 NOTE — Patient Instructions (Signed)
You can take up to 4 ibuprofen 3 times per day for your pain.  Start with 2 pills 3 times per day and then increase it up to 4 pills three times a daydepending upon your pain If you take 4 pills 3 times per day and it is not helping call me

## 2018-05-30 NOTE — Progress Notes (Signed)
   Subjective:    Patient ID: [name] [name], female    DOB: Nov 06, 1950, 68 y.o.   MRN: [id number]  HPI She is here for evaluation of bilateral shoulder as well as knee pain for the last month.  She has been involved in an exercise program to try and help her lose weight and has been doing light weights but keeping the shoulders from moving too much but in spite of that she is developed bilateral shoulder pain.  She also has an underlying history of knee pain, right greater than left and again since she has started walking more especially on an incline, she has had more pain. She also continues to have a left earache as well as cough, congestion and occasional wheezing.  She was given Biaxin which helped but did not make it totally go away.   Review of Systems     Objective:   Physical Exam Alert and in no distress. Tympanic membranes and canals are normal. Pharyngeal area is normal. Neck is supple without adenopathy or thyromegaly. Cardiac exam shows a regular sinus rhythm without murmurs or gallops. Lungs are clear to auscultation. Left shoulder exam difficult to do due to pain limiting testing.  Right shoulder exam showed fairly good motion with negative drop arm.  Neer's and Hawkins test was uncomfortable.  No sulcus sign.  Bilateral knee exam showed no effusion, crepitus, palpable tenderness.  Ligaments intact.       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  Explained in detail using up to 800 mg 3 times daily of ibuprofen.  We will also place her on Levaquin.  She will return here in 2 weeks for a recheck.

## 2018-05-31 NOTE — Telephone Encounter (Signed)
Find out what she is referring to 

## 2018-06-02 NOTE — Telephone Encounter (Signed)
It is very unlikely that the Levaquin is causing it.  Usually it is with tendon rupture rather than just aches and pains.

## 2018-06-02 NOTE — Telephone Encounter (Signed)
Pt came in Friday for shoulder, leg and arm pain and it is becoming more painful. She says the side effects may come from taking Levaquin.  Pt has had three weeks of this med. Pt read it on the indication form and the issues she is having is listed on the side effects on the form. Pt stopped taking it as of Friday. Please advise if this is or could be the cause of her arm, shoulder, and leg pain. Pt was taking the abx for wheezing and ear issuses and this is still ongoing. Please advise what pt should do about these issues. Pt also says the ibuprofen is not working . [facility]

## 2018-06-03 NOTE — Telephone Encounter (Signed)
yes

## 2018-06-03 NOTE — Telephone Encounter (Signed)
Should pt continue the ABX . Please advise [facility]

## 2018-06-03 NOTE — Telephone Encounter (Signed)
Think this was supposed to go to you 

## 2018-06-03 NOTE — Telephone Encounter (Signed)
Pt was advised [facility] 

## 2018-06-13 ENCOUNTER — Encounter: Payer: Self-pay | Admitting: Family Medicine

## 2018-06-13 ENCOUNTER — Ambulatory Visit: Payer: Medicare Other | Admitting: Family Medicine

## 2018-06-13 ENCOUNTER — Ambulatory Visit (INDEPENDENT_AMBULATORY_CARE_PROVIDER_SITE_OTHER): Payer: Medicare Other | Admitting: Family Medicine

## 2018-06-13 VITALS — BP 126/82 | HR 65 | Temp 98.3°F | Wt 213.4 lb

## 2018-06-13 DIAGNOSIS — M25512 Pain in left shoulder: Secondary | ICD-10-CM | POA: Diagnosis not present

## 2018-06-13 DIAGNOSIS — M25511 Pain in right shoulder: Secondary | ICD-10-CM | POA: Diagnosis not present

## 2018-06-13 DIAGNOSIS — J4520 Mild intermittent asthma, uncomplicated: Secondary | ICD-10-CM | POA: Diagnosis not present

## 2018-06-13 MED ORDER — LEVOFLOXACIN 500 MG PO TABS: 500.0000 mg | ORAL_TABLET | Freq: Every day | ORAL | 0 refills | Status: DC

## 2018-06-13 MED ORDER — TRAMADOL HCL 50 MG PO TABS: 50.0000 mg | ORAL_TABLET | Freq: Three times a day (TID) | ORAL | 0 refills | Status: DC | PRN

## 2018-06-13 NOTE — Progress Notes (Signed)
   Subjective:    Patient ID: [name] [name], female    DOB: 1951/02/08, 68 y.o.   MRN: [id number]  HPI She is here for recheck.  She states that she is doing better from her bronchitis but still having some left ear pain.  The cough and congestion have improved. She continues to complain of bilateral shoulder pain, left greater than right and it did not respond to ibuprofen.  She also states that her knees are causing some difficulty as well.   Review of Systems     Objective:   Physical Exam Alert and in no distress. Tympanic membrane on the left is slightly dull, right is normal canals are normal. Pharyngeal area is normal. Neck is supple without adenopathy or thyromegaly. Cardiac exam shows a regular sinus rhythm without murmurs or gallops. Lungs are clear to auscultation. Pain on active motion of both shoulders and also pain with passive motion and inability to fully assess the shoulders.      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 Hopefully the second dose of the Levaquin will get rid of the residual otitis as well as slight bronchitis.

## 2018-06-14 LAB — SEDIMENTATION RATE: Sed Rate: 17 mm/hr (ref 0–40)

## 2018-06-16 ENCOUNTER — Other Ambulatory Visit: Payer: Self-pay

## 2018-06-16 ENCOUNTER — Ambulatory Visit: Payer: Medicare Other | Admitting: Family Medicine

## 2018-06-16 DIAGNOSIS — M25511 Pain in right shoulder: Secondary | ICD-10-CM

## 2018-06-16 DIAGNOSIS — M25512 Pain in left shoulder: Principal | ICD-10-CM

## 2018-06-16 DIAGNOSIS — M1711 Unilateral primary osteoarthritis, right knee: Secondary | ICD-10-CM

## 2018-06-17 ENCOUNTER — Ambulatory Visit (INDEPENDENT_AMBULATORY_CARE_PROVIDER_SITE_OTHER): Payer: Self-pay

## 2018-06-17 ENCOUNTER — Encounter (INDEPENDENT_AMBULATORY_CARE_PROVIDER_SITE_OTHER): Payer: Self-pay | Admitting: Orthopedic Surgery

## 2018-06-17 ENCOUNTER — Ambulatory Visit (INDEPENDENT_AMBULATORY_CARE_PROVIDER_SITE_OTHER): Payer: Medicare Other | Admitting: Orthopedic Surgery

## 2018-06-17 VITALS — Ht 65.0 in | Wt 195.0 lb

## 2018-06-17 DIAGNOSIS — G8929 Other chronic pain: Secondary | ICD-10-CM | POA: Diagnosis not present

## 2018-06-17 DIAGNOSIS — M25512 Pain in left shoulder: Secondary | ICD-10-CM

## 2018-06-17 DIAGNOSIS — M25561 Pain in right knee: Secondary | ICD-10-CM | POA: Diagnosis not present

## 2018-06-17 DIAGNOSIS — M25511 Pain in right shoulder: Secondary | ICD-10-CM

## 2018-06-17 DIAGNOSIS — M25562 Pain in left knee: Secondary | ICD-10-CM

## 2018-06-21 ENCOUNTER — Encounter (INDEPENDENT_AMBULATORY_CARE_PROVIDER_SITE_OTHER): Payer: Self-pay | Admitting: Orthopedic Surgery

## 2018-06-21 NOTE — Progress Notes (Signed)
Office Visit Note   Patient: [name] [name]           Date of Birth: June 21, 1950           MRN: [id number] Visit Date: 06/17/2018 Requested by: Ronnald Nian, MD [address] [place], [place] [address] PCP: Ronnald Nian, MD  Subjective: Chief Complaint  Patient presents with  . Right Shoulder - Pain  . Left Shoulder - Pain  . Right Knee - Pain    HPI: [name] [name] is a patient with bilateral shoulder pain and bilateral knee pain.  She states "everything hurts".  She was using exercise equipment machine in December.  She was doing a stepper and pulling up on ropes.  She reports bilateral shoulder pain left worse than right.  She states that 1 time she could hardly use her arm but that has improved some.  She states her legs are bothering her as well particularly the right knee.  Injection performed 02/23/2018 which helped some.  At one point the pain was so bad she could not get up.  She is on tramadol for pain.              ROS: All systems reviewed are negative as they relate to the chief complaint within the history of present illness.  Patient denies  fevers or chills.   Assessment & Plan: Visit Diagnoses:  1. Acute pain of left shoulder   2. Acute pain of right shoulder   3. Chronic pain of right knee     Plan: Impression is bilateral knee pain with moderate degenerative changes noted on plain radiographs.  Not much of an effusion in either knee today.  Heart states she has a structural problem there other than wear and tear.  No mechanical symptoms in the knee present.  I will try an injection into that right knee 4 and 1 and if that does not help then neck step would be MRI to see if there is something arthroscopically treatable.  The one to do that right away because of the high likelihood of pathology present in the knee which may or may not be symptomatic.  In regards to the shoulders her radiographs look reasonable but she may have labral pathology or sore impingement.   That left shoulder is bothering her more.  Divided injection performed today into the subacromial space and glenohumeral joint.  Also want to start her in physical therapy at [facility].  We will check her back in about 8 weeks if she is not improving and we could consider further imaging of the areas in question at that time.  Follow-Up Instructions: Return if symptoms worsen or fail to improve.   Orders:  Orders Placed This Encounter  Procedures  . XR KNEE 3 VIEW RIGHT  . XR Shoulder Right  . XR Shoulder Left  . Ambulatory referral to Physical Therapy   No orders of the defined types were placed in this encounter.     Procedures: No procedures performed   Clinical Data: No additional findings.  Objective: Vital Signs: Ht 5\' 5"  (1.651 m)   Wt 195 lb (88.5 kg)   BMI 32.45 kg/m   Physical Exam:   Constitutional: Patient appears well-developed HEENT:  Head: Normocephalic Eyes:EOM are normal Neck: Normal range of motion Cardiovascular: Normal rate Pulmonary/chest: Effort normal Neurologic: Patient is alert Skin: Skin is warm Psychiatric: Patient has normal mood and affect    Ortho Exam: Ortho exam demonstrates pretty good cervical spine  range of motion.  Patient has 5 out of 5 grip EPL FPL interosseous wrist flexion extension bicep triceps and deltoid strength.  She does have pain with passive range of motion of that right shoulder with positive impingement signs but no definite rotator cuff weakness to infraspinatus supraspinatus and subscap muscle testing.  Do not detect a lot of coarseness or grinding but she is limited by pain with free overhead motion.  Both knees are examined.  No effusion is present.  Collateral patient ligaments are stable.  Patellofemoral crepitus is present along with medial and lateral joint line tenderness bilaterally.  Pedal pulses intact.  Range of motion full and extensor mechanism is intact and nontender.  Specialty Comments:  No  specialty comments available.  Imaging: No results found.   PMFS History: Patient Active Problem List   Diagnosis Date Noted  . Fatigue 04/28/2018  . JVD (jugular venous distension) 04/28/2018  . Hx of adenomatous colonic polyps 12/13/2017  . SOB (shortness of breath) 04/16/2014  . Essential hypertension, benign 10/16/2011  . Hyperlipidemia 10/16/2011  . Osteopenia 10/16/2011  . Hemorrhoids 10/16/2011  . GERD (gastroesophageal reflux disease) 10/16/2011   Past Medical History:  Diagnosis Date  . Anemia   . Arthritis   . ASHD (arteriosclerotic heart disease)   . Blood in stool    GI consult 2012, EGD and colonoscopy  . Eczema   . GERD (gastroesophageal reflux disease)   . H/O bone density study 07/09/2006   [facility]; [facility]  . Hemorrhoid    internal and external, general surgery consult 04/2011  . History of echocardiogram 02/17/2008   normal LV structure and function w/ EF >55%, trace MR, TR and PI  . History of nuclear stress test 02/2008   no significant ischemia, low risk scan, post stress EF 68%  . Hyperlipidemia   . Hypertension   . Osteopenia     Family History  Problem Relation Age of Onset  . Heart disease Mother        Pacemaker  . Diabetes Mother   . Cancer Father        lung  . Diabetes Father   . Diabetes Sister   . Diabetes Brother   . Heart disease Brother        1 brother died MI age 51 yo  . Cancer Other        maternal side,non first degree breast cancer  . Stroke Neg Hx   . Hypertension Neg Hx   . Hyperlipidemia Neg Hx     Past Surgical History:  Procedure Laterality Date  . ABDOMINAL HYSTERECTOMY    . COLONOSCOPY  2012, 2008   Dr. Loreta Ave  . ESOPHAGOGASTRODUODENOSCOPY  2012, 2008   Dr. Loreta Ave   Social History   Occupational History  . Occupation: [employer]    Comment: Retired   Tobacco Use  . Smoking status: Never Smoker  . Smokeless tobacco: Never Used  Substance and Sexual Activity  . Alcohol use: No  .  Drug use: No  . Sexual activity: Not Currently

## 2018-06-24 ENCOUNTER — Ambulatory Visit (INDEPENDENT_AMBULATORY_CARE_PROVIDER_SITE_OTHER): Payer: Medicare Other | Admitting: Orthopaedic Surgery

## 2018-07-30 ENCOUNTER — Ambulatory Visit (INDEPENDENT_AMBULATORY_CARE_PROVIDER_SITE_OTHER): Payer: Medicare Other | Admitting: Orthopedic Surgery

## 2018-08-04 ENCOUNTER — Telehealth (INDEPENDENT_AMBULATORY_CARE_PROVIDER_SITE_OTHER): Payer: Self-pay

## 2018-08-04 NOTE — Telephone Encounter (Signed)
Patient is scheduled 09/24/2018 at 10:45am

## 2018-08-04 NOTE — Telephone Encounter (Signed)
Dr Dean talked with patient via telephone Please cancel appt for 08/06/18 and reschedule for 8 weeks out.  

## 2018-08-06 ENCOUNTER — Ambulatory Visit (INDEPENDENT_AMBULATORY_CARE_PROVIDER_SITE_OTHER): Payer: Medicare Other | Admitting: Orthopedic Surgery

## 2018-08-06 NOTE — Telephone Encounter (Signed)
I talked with [name] [name].  She is doing better.  Shoulder and knee are manageable.  We are going to reassess her in 8 weeks.  No need for further imaging at this time which was the point of our clinic appointment.

## 2018-09-24 ENCOUNTER — Ambulatory Visit (INDEPENDENT_AMBULATORY_CARE_PROVIDER_SITE_OTHER): Payer: Medicare Other | Admitting: Orthopedic Surgery

## 2018-10-01 ENCOUNTER — Encounter: Payer: Self-pay | Admitting: Orthopedic Surgery

## 2018-10-01 ENCOUNTER — Other Ambulatory Visit: Payer: Self-pay

## 2018-10-01 ENCOUNTER — Ambulatory Visit: Payer: Medicare Other | Admitting: Orthopedic Surgery

## 2018-10-01 DIAGNOSIS — M25511 Pain in right shoulder: Secondary | ICD-10-CM

## 2018-10-01 DIAGNOSIS — G8929 Other chronic pain: Secondary | ICD-10-CM | POA: Diagnosis not present

## 2018-10-01 DIAGNOSIS — M25561 Pain in right knee: Secondary | ICD-10-CM

## 2018-10-01 DIAGNOSIS — M25512 Pain in left shoulder: Secondary | ICD-10-CM

## 2018-10-01 NOTE — Progress Notes (Signed)
Office Visit Note   Patient: [name] [name]           Date of Birth: 09/28/1950           MRN: [id number] Visit Date: 10/01/2018 Requested by: [name] [name], [name] C, MD [address] [place], [place] [id number] PCP: [name] [name], [name] C, MD  Subjective: Chief Complaint  Patient presents with   Left Shoulder - Pain   Right Knee - Pain    HPI: [name] [name] is a patient who presents for follow-up of left shoulder and right knee pain.  Left shoulder injection helped and her shoulder has improved.  She has been in physical therapy working on range of motion for frozen shoulder.  Right knee has continued pain but radiographs last clinic visit did not show much in terms of arthritis.  She also has some low back pain is also reporting some left ankle pain.  All this started from vigorous overexercise.              ROS: All systems reviewed are negative as they relate to the chief complaint within the history of present illness.  Patient denies  fevers or chills.   Assessment & Plan: Visit Diagnoses:  1. Acute pain of left shoulder   2. Acute pain of right shoulder   3. Chronic pain of right knee     Plan: Impression is left frozen shoulder which is improved with injection.  Right knee has no effusion and that is only mildly improved.  She also is reporting some low back pain but has no nerve root tension signs and no weakness in the legs.  Also reporting left ankle pain and she may be having a little bit of early posterior tib tendon dysfunction but still has appropriate heel inversion when she stands on her toes.  For now I think the thing to do would be to wear shoes that have arches.  Currently her flats do not have any arches.  The right knee I think if it is to the point where she would want to have an arthroscopic evaluation of the knee and we can get an MRI scan but she does not want surgery really under almost any circumstances at this time.  For her low back I think therapy for the back in the  knee is indicated.  I think the shoulder she needs to continue to work on range of motion and strengthening exercises but predominantly range of motion.  Follow-Up Instructions: Return if symptoms worsen or fail to improve.   Orders:  Orders Placed This Encounter  Procedures   Ambulatory referral to Physical Therapy   No orders of the defined types were placed in this encounter.     Procedures: No procedures performed   Clinical Data: No additional findings.  Objective: Vital Signs: There were no vitals taken for this visit.  Physical Exam:   Constitutional: Patient appears well-developed HEENT:  Head: Normocephalic Eyes:EOM are normal Neck: Normal range of motion Cardiovascular: Normal rate Pulmonary/chest: Effort normal Neurologic: Patient is alert Skin: Skin is warm Psychiatric: Patient has normal mood and affect    Ortho Exam: Ortho exam demonstrates good cervical spine range of motion.  She has very good rotator cuff strength on the left infraspinatus supraspinatus and subscap muscle testing.  Patient has intact radial pulse bilaterally.  Her range of motion is actually improved and now she has passively only about 10 or 15 degrees less forward flexion of the left compared to the right.  External rotation of 15 degrees of abduction is about 60 bilaterally.  Right knee has no effusion good range of motion stable collateral crucial ligaments and no nerve root tension signs.  On that left ankle she has palpable intact nontender anterior to posterior to peroneal and Achilles tendons but does have slight midfoot collapse when standing.  Does have appropriate heel inversion when she stands on both toes.  Ankle range of motion tibiotalar subtalar transverse tarsal symmetric and intact. Specialty Comments:  No specialty comments available.  Imaging: No results found.   PMFS History: Patient Active Problem List   Diagnosis Date Noted   Fatigue 04/28/2018   JVD  (jugular venous distension) 04/28/2018   Hx of adenomatous colonic polyps 12/13/2017   SOB (shortness of breath) 04/16/2014   Essential hypertension, benign 10/16/2011   Hyperlipidemia 10/16/2011   Osteopenia 10/16/2011   Hemorrhoids 10/16/2011   GERD (gastroesophageal reflux disease) 10/16/2011   Past Medical History:  Diagnosis Date   Anemia    Arthritis    ASHD (arteriosclerotic heart disease)    Blood in stool    GI consult 2012, EGD and colonoscopy   Eczema    GERD (gastroesophageal reflux disease)    H/O bone density study 07/09/2006   [facility]; [facility]   Hemorrhoid    internal and external, general surgery consult 04/2011   History of echocardiogram 02/17/2008   normal LV structure and function w/ EF >55%, trace MR, TR and PI   History of nuclear stress test 02/2008   no significant ischemia, low risk scan, post stress EF 68%   Hyperlipidemia    Hypertension    Osteopenia     Family History  Problem Relation Age of Onset   Heart disease Mother        Pacemaker   Diabetes Mother    Cancer Father        lung   Diabetes Father    Diabetes Sister    Diabetes Brother    Heart disease Brother        1 brother died MI age 3 yo   Cancer Other        maternal side,non first degree breast cancer   Stroke Neg Hx    Hypertension Neg Hx    Hyperlipidemia Neg Hx     Past Surgical History:  Procedure Laterality Date   ABDOMINAL HYSTERECTOMY     COLONOSCOPY  2012, 2008   Dr. Loreta Ave   ESOPHAGOGASTRODUODENOSCOPY  2012, 2008   Dr. Loreta Ave   Social History   Occupational History   Occupation: [employer]    Comment: Retired   Tobacco Use   Smoking status: Never Smoker   Smokeless tobacco: Never Used  Substance and Sexual Activity   Alcohol use: No   Drug use: No   Sexual activity: Not Currently

## 2018-10-04 ENCOUNTER — Telehealth: Payer: Self-pay

## 2018-10-04 NOTE — Telephone Encounter (Signed)
Unable to leave vm, vm is full. UTA patient for covid s/sx following potential covid exposure.

## 2018-10-14 NOTE — Telephone Encounter (Signed)
Phone call to pt.  Advised she may have been exposed to a staff member that tested positive for COVID 19, at her recent [place]. On 5/27.  Offered to schedule for testing.  Stated she has a COVID 19 screening test scheduled tomorrow, 6/10, at the [facility], on [facility] due to exposure to a friend that tested positive. Reported this was scheduled through [facility]. HD.

## 2018-12-24 LAB — HM MAMMOGRAPHY

## 2019-01-20 ENCOUNTER — Other Ambulatory Visit: Payer: Self-pay | Admitting: Family Medicine

## 2019-01-20 DIAGNOSIS — I10 Essential (primary) hypertension: Secondary | ICD-10-CM

## 2019-01-28 ENCOUNTER — Other Ambulatory Visit: Payer: Self-pay

## 2019-01-28 ENCOUNTER — Encounter: Payer: Self-pay | Admitting: Family Medicine

## 2019-01-28 ENCOUNTER — Ambulatory Visit (INDEPENDENT_AMBULATORY_CARE_PROVIDER_SITE_OTHER): Payer: Medicare Other | Admitting: Family Medicine

## 2019-01-28 ENCOUNTER — Ambulatory Visit: Payer: Self-pay | Admitting: Family Medicine

## 2019-01-28 VITALS — BP 136/88 | HR 54 | Temp 98.4°F | Ht 65.0 in | Wt 213.2 lb

## 2019-01-28 DIAGNOSIS — Z0000 Encounter for general adult medical examination without abnormal findings: Secondary | ICD-10-CM | POA: Diagnosis not present

## 2019-01-28 DIAGNOSIS — K219 Gastro-esophageal reflux disease without esophagitis: Secondary | ICD-10-CM

## 2019-01-28 DIAGNOSIS — Z1211 Encounter for screening for malignant neoplasm of colon: Secondary | ICD-10-CM

## 2019-01-28 DIAGNOSIS — Z86010 Personal history of colonic polyps: Secondary | ICD-10-CM

## 2019-01-28 DIAGNOSIS — I10 Essential (primary) hypertension: Secondary | ICD-10-CM | POA: Diagnosis not present

## 2019-01-28 DIAGNOSIS — N3281 Overactive bladder: Secondary | ICD-10-CM

## 2019-01-28 DIAGNOSIS — M8580 Other specified disorders of bone density and structure, unspecified site: Secondary | ICD-10-CM

## 2019-01-28 DIAGNOSIS — K649 Unspecified hemorrhoids: Secondary | ICD-10-CM | POA: Diagnosis not present

## 2019-01-28 DIAGNOSIS — Z860101 Personal history of adenomatous and serrated colon polyps: Secondary | ICD-10-CM

## 2019-01-28 DIAGNOSIS — E785 Hyperlipidemia, unspecified: Secondary | ICD-10-CM | POA: Diagnosis not present

## 2019-01-28 DIAGNOSIS — M1990 Unspecified osteoarthritis, unspecified site: Secondary | ICD-10-CM | POA: Insufficient documentation

## 2019-01-28 DIAGNOSIS — E669 Obesity, unspecified: Secondary | ICD-10-CM

## 2019-01-28 DIAGNOSIS — Z23 Encounter for immunization: Secondary | ICD-10-CM

## 2019-01-28 MED ORDER — FELODIPINE ER 10 MG PO TB24: ORAL_TABLET | ORAL | 3 refills | Status: DC

## 2019-01-28 MED ORDER — ATORVASTATIN CALCIUM 40 MG PO TABS: 40.0000 mg | ORAL_TABLET | Freq: Every day | ORAL | 3 refills | Status: DC

## 2019-01-28 MED ORDER — DEXILANT 60 MG PO CPDR: 1.0000 | DELAYED_RELEASE_CAPSULE | Freq: Every day | ORAL | 3 refills | Status: DC

## 2019-01-28 MED ORDER — BISOPROLOL-HYDROCHLOROTHIAZIDE 5-6.25 MG PO TABS: 2.0000 | ORAL_TABLET | Freq: Every day | ORAL | 1 refills | Status: DC

## 2019-01-28 NOTE — Progress Notes (Signed)
[name] [name] is a 68 y.o. female who presents for CPE,annual wellness visit and follow-up on chronic medical conditions.  She has noticed blood in her stools.  She does have a previous history of hemorrhoids and has been seen by general surgery in the past about this.  They recommended conservative care.  She also has a history of colonic polyps in 2012 but has not seen follow-up on that.  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  She says that this has helped greatly.  She continues on her blood pressure medications and is having no difficulty with that.  Pap and pelvic done recently as well as mammogram she takes Dexilant twice per week for control of her GERD.  She does have a history of OAB and was given Vesicare but did not take it due to reading the side effect profile in her becoming scared of that.  She recognizes that she is overweight and has made some minor changes in her exercise pattern.  He also has a history of osteopenia but has not had a DEXA in several years.  She also has hyperlipidemia and is taking Lipitor and having no aches or pains with this.  She is also taking multiple over-the-counter medications.  Otherwise her family and social history was reviewed.  Immunizations and Health Maintenance Immunization History  Administered Date(s) Administered  . Fluad Quad(high Dose 65+) 01/28/2019  . Influenza Split 02/02/2015, 12/17/2015  . Influenza, High Dose Seasonal PF 01/08/2017, 02/04/2018  . Pneumococcal Conjugate-13 11/09/2016  . Pneumococcal Polysaccharide-23 03/11/2008  . Td 12/31/1995  . Tdap 10/16/2011  . Zoster 10/17/2011  . Zoster Recombinat (Shingrix) 11/09/2016, 01/08/2017   Health Maintenance Due  Topic Date Due  . INFLUENZA VACCINE  12/06/2018    Last Pap smear: 8/17/ 2020 Last mammogram: 12/22/2018 Last colonoscopy: 03-09-2011 Last DEXA: 10-06-11 Dentist: 01-26-19 Ophtho:  11/2018 Exercise: bike , walking   Other doctors caring for patient include: [name] [name] ortho, [name] [name] cardio,   Advanced directives:Not on file.  Information given Does Patient Have a Medical Advance Directive?: No  Depression screen:  See questionnaire below.  Depression screen [name] [name] 2/9 01/28/2019 12/13/2017 11/09/2016 10/18/2014 10/15/2012  Decreased Interest 0 0 0 0 0  Down, Depressed, Hopeless 1 0 0 0 0  PHQ - 2 Score 1 0 0 0 0    Fall Risk Screen: see questionnaire below. Fall Risk  01/28/2019 12/13/2017 11/09/2016 10/18/2014  Falls in the past year? 0 No No No    ADL screen:  See questionnaire below Functional Status Survey: Is the patient deaf or have difficulty hearing?: No Does the patient have difficulty seeing, even when wearing glasses/contacts?: No Does the patient have difficulty concentrating, remembering, or making decisions?: No Does the patient have difficulty walking or climbing stairs?: No Does the patient have difficulty dressing or bathing?: No Does the patient have difficulty doing errands alone such as visiting a doctor's office or shopping?: No   Review of Systems Constitutional: -, -unexpected weight change, -anorexia, -fatigue Allergy: -sneezing, -itching, -congestion Dermatology: denies changing moles, rash, lumps ENT: -runny nose, -ear pain, -sore throat,  Cardiology:  -chest pain, -palpitations, -orthopnea, Respiratory: -cough, -shortness of breath, -dyspnea on exertion, -wheezing,  Gastroenterology: -abdominal pain, -nausea, -vomiting, -diarrhea, -constipation, -dysphagia Hematology: -bleeding or bruising problems Musculoskeletal: -arthralgias, -myalgias, -joint swelling, -back pain, - Ophthalmology: -vision changes,  Urology: -dysuria, -difficulty urinating,  -urinary frequency, -urgency,  incontinence Neurology: -, -numbness, , -memory loss, -falls, -dizziness    PHYSICAL EXAM:  BP 136/88 (BP Location: Left Arm, Patient Position: Sitting)   Pulse (!)  54   Temp 98.4 F (36.9 C)   Ht 5\' 5"  (1.651 m)   Wt 213 lb 3.2 oz (96.7 kg)   SpO2 95%   BMI 35.48 kg/m   General Appearance: Alert, cooperative, no distress, appears stated age Head: Normocephalic, without obvious abnormality, atraumatic Eyes: PERRL, conjunctiva/corneas clear, EOM's intact, fundi benign Ears: Normal TM's and external ear canals Nose: Nares normal, mucosa normal, no drainage or sinus tenderness Throat: Lips, mucosa, and tongue normal; teeth and gums normal Neck: Supple, no lymphadenopathy;  thyroid:  no enlargement/tenderness/nodules; no carotid bruit or JVD Lungs: Clear to auscultation bilaterally without wheezes, rales or ronchi; respirations unlabored Heart: Regular rate and rhythm, S1 and S2 normal, no murmur, rubor gallop Abdomen: Soft, non-tender, nondistended, normoactive bowel sounds,  no masses, no hepatosplenomegaly Extremities: No clubbing, cyanosis or edema Pulses: 2+ and symmetric all extremities Skin:  Skin color, texture, turgor normal, no rashes or lesions Lymph nodes: Cervical, supraclavicular normal Neurologic:  CNII-XII intact, normal strength, sensation and gait; reflexes 2+ and symmetric throughout Psych: Normal mood, affect, hygiene and grooming.  ASSESSMENT/PLAN: Routine general medical examination at a health care facility - Plan: CBC with Differential/Platelet, Comprehensive metabolic panel, Lipid panel  Need for influenza vaccination - Plan: Flu Vaccine QUAD High Dose(Fluad)  Essential hypertension, benign - Plan: CBC with Differential/Platelet, Comprehensive metabolic panel, felodipine (PLENDIL) 10 MG 24 hr tablet, bisoprolol-hydrochlorothiazide (ZIAC) 5-6.25 MG tablet  Hyperlipidemia, unspecified hyperlipidemia type - Plan: Lipid panel, atorvastatin (LIPITOR) 40 MG tablet  Osteopenia, unspecified location - Plan: DG Bone Density, VITAMIN D 25 Hydroxy (Vit-D Deficiency, Fractures)  Hemorrhoids, unspecified hemorrhoid type     I  discussed the treatment of hemorrhoids with her again and at this point she is not interested in pursuing this any further. Hx of adenomatous colonic polyps    Refer to [name] [name] for follow-up Gastroesophageal reflux disease without esophagitis - Plan: dexlansoprazole (DEXILANT) 60 MG capsule    Continue on present Dexilant dosing. Arthritis     Strongly encouraged her to continue with her exercise and range of motion exercises.  OAB (overactive bladder)    Her gynecologist has arranged for her to be seen by urology for this. Obesity (BMI 30-39.9)     Discussed diet and exercise with her.  Strongly encouraged her to get at least 150 minutes week of something physical as well as cutting back on carbohydrates.   Immunization recommendations discussed.  Colonoscopy recommendations reviewed   Medicare Attestation I have personally reviewed: The patient's medical and social history Their use of alcohol, tobacco or illicit drugs Their current medications and supplements The patient's functional ability including ADLs,fall risks, home safety risks, cognitive, and hearing and visual impairment Diet and physical activities Evidence for depression or mood disorders  The patient's weight, height, and BMI have been recorded in the chart.  I have made referrals, counseling, and provided education to the patient based on review of the above and I have provided the patient with a written personalized care plan for preventive services.     Jill Alexanders, MD   01/28/2019

## 2019-01-28 NOTE — Addendum Note (Signed)
Addended by: Elyse Jarvis on: 01/28/2019 04:08 PM   Modules accepted: Orders

## 2019-01-28 NOTE — Patient Instructions (Addendum)
  [name] [name] , Thank you for taking time to come for your Medicare Wellness Visit. I appreciate your ongoing commitment to your health goals. Please review the following plan we discussed and let me know if I can assist you in the future.   These are the goals we discussed:20 minutes of something daily or 150 minutes a week of something look at your fluid intake especially in regard to carbohydrate and the easiest way to remember that is white food. Cut your carbohydrates in half    This is a list of the screening recommended for you and due dates:  Health Maintenance  Topic Date Due  . Flu Shot  12/06/2018  . Mammogram  12/21/2020  . Colon Cancer Screening  03/08/2021  . Tetanus Vaccine  10/15/2021  . DEXA scan (bone density measurement)  Completed  .  Hepatitis C: One time screening is recommended by Center for Disease Control  (CDC) for  adults born from [birth year] through 1964.   Completed  . Pneumonia vaccines  Discontinued

## 2019-01-30 LAB — COMPREHENSIVE METABOLIC PANEL
ALT: 22 IU/L (ref 0–32)
AST: 28 IU/L (ref 0–40)
Albumin/Globulin Ratio: 2 (ref 1.2–2.2)
Albumin: 4.7 g/dL (ref 3.8–4.8)
Alkaline Phosphatase: 95 IU/L (ref 39–117)
BUN/Creatinine Ratio: 7 — ABNORMAL LOW (ref 12–28)
BUN: 8 mg/dL (ref 8–27)
Bilirubin Total: 0.7 mg/dL (ref 0.0–1.2)
CO2: 24 mmol/L (ref 20–29)
Calcium: 10.3 mg/dL (ref 8.7–10.3)
Chloride: 103 mmol/L (ref 96–106)
Creatinine, Ser: 1.19 mg/dL — ABNORMAL HIGH (ref 0.57–1.00)
GFR calc Af Amer: 55 mL/min/{1.73_m2} — ABNORMAL LOW (ref >59)
GFR calc non Af Amer: 47 mL/min/{1.73_m2} — ABNORMAL LOW (ref >59)
Globulin, Total: 2.3 g/dL (ref 1.5–4.5)
Glucose: 85 mg/dL (ref 65–99)
Potassium: 4.7 mmol/L (ref 3.5–5.2)
Sodium: 140 mmol/L (ref 134–144)
Total Protein: 7 g/dL (ref 6.0–8.5)

## 2019-01-30 LAB — CBC WITH DIFFERENTIAL/PLATELET
Basophils Absolute: 0.1 10*3/uL (ref 0.0–0.2)
Basos: 1 %
EOS (ABSOLUTE): 0.4 10*3/uL (ref 0.0–0.4)
Eos: 8 %
Hematocrit: 39.1 % (ref 34.0–46.6)
Hemoglobin: 12.8 g/dL (ref 11.1–15.9)
Immature Grans (Abs): 0 10*3/uL (ref 0.0–0.1)
Immature Granulocytes: 0 %
Lymphocytes Absolute: 2.1 10*3/uL (ref 0.7–3.1)
Lymphs: 40 %
MCH: 29.2 pg (ref 26.6–33.0)
MCHC: 32.7 g/dL (ref 31.5–35.7)
MCV: 89 fL (ref 79–97)
Monocytes Absolute: 0.6 10*3/uL (ref 0.1–0.9)
Monocytes: 12 %
Neutrophils Absolute: 2 10*3/uL (ref 1.4–7.0)
Neutrophils: 39 %
Platelets: 255 10*3/uL (ref 150–450)
RBC: 4.38 x10E6/uL (ref 3.77–5.28)
RDW: 14.1 % (ref 11.7–15.4)
WBC: 5.1 10*3/uL (ref 3.4–10.8)

## 2019-01-30 LAB — LIPID PANEL
Chol/HDL Ratio: 3 ratio (ref 0.0–4.4)
Cholesterol, Total: 201 mg/dL — ABNORMAL HIGH (ref 100–199)
HDL: 66 mg/dL (ref >39)
LDL Chol Calc (NIH): 114 mg/dL — ABNORMAL HIGH (ref 0–99)
Triglycerides: 122 mg/dL (ref 0–149)
VLDL Cholesterol Cal: 21 mg/dL (ref 5–40)

## 2019-01-30 LAB — VITAMIN D 25 HYDROXY (VIT D DEFICIENCY, FRACTURES): Vit D, 25-Hydroxy: 27.9 ng/mL — ABNORMAL LOW (ref 30.0–100.0)

## 2019-03-12 ENCOUNTER — Ambulatory Visit: Payer: Medicare Other | Admitting: Family Medicine

## 2019-03-12 ENCOUNTER — Encounter: Payer: Self-pay | Admitting: Family Medicine

## 2019-03-12 ENCOUNTER — Other Ambulatory Visit: Payer: Self-pay

## 2019-03-12 VITALS — BP 144/87 | Temp 97.0°F | Wt 213.0 lb

## 2019-03-12 DIAGNOSIS — J309 Allergic rhinitis, unspecified: Secondary | ICD-10-CM

## 2019-03-12 NOTE — Progress Notes (Signed)
   Subjective:    Patient ID: [name] [name], female    DOB: 03/05/1951, 68 y.o.   MRN: [id number]  HPI Documentation for virtual telephone encounter.  Interactive audio and video telecommunications were attempted between this provider and patient, however she did not have access to video capability.  We continued and completed visit with audio only. The patient was located at home. The provider was located in the office. The patient did consent to this visit and is aware of possible charges through their insurance for this visit. The other persons participating in this telemedicine service were none. Time spent on call was 5 minutes and in review of previous records >9 minutes total. This virtual service is not related to other E/M service within previous 7 days. She has concerns over the need for an antibiotic.  She does complain of difficulty with rhinorrhea but no fever and only occasional cough for the last couple of days.  No sore throat, earache.  She has been using DayQuil and Advil cold and sinus.  She says that that works the best of anything.  She does have springtime allergies.   Review of Systems     Objective:   Physical Exam  Alert and in no distress otherwise not examined      Assessment & Plan:  Allergic rhinitis, unspecified seasonality, unspecified trigger Difficult to get a good history from her but I explained that I thought that her symptoms were mainly allergy related and to continue with her present medication regimen and call if further trouble.

## 2019-04-10 ENCOUNTER — Telehealth: Payer: Self-pay | Admitting: Family Medicine

## 2019-04-10 NOTE — Telephone Encounter (Signed)
Received requested records from [facility]  

## 2019-04-28 ENCOUNTER — Other Ambulatory Visit: Payer: Self-pay

## 2019-04-28 ENCOUNTER — Encounter (HOSPITAL_COMMUNITY): Payer: Self-pay | Admitting: Emergency Medicine

## 2019-04-28 ENCOUNTER — Emergency Department (HOSPITAL_COMMUNITY)
Admission: EM | Admit: 2019-04-28 | Discharge: 2019-04-29 | Disposition: A | Discharge status: Home / Self Care | Payer: Medicare Other | Attending: Emergency Medicine | Admitting: Emergency Medicine

## 2019-04-28 ENCOUNTER — Emergency Department (HOSPITAL_COMMUNITY): Payer: Medicare Other

## 2019-04-28 DIAGNOSIS — Z20828 Contact with and (suspected) exposure to other viral communicable diseases: Secondary | ICD-10-CM | POA: Insufficient documentation

## 2019-04-28 DIAGNOSIS — R062 Wheezing: Secondary | ICD-10-CM | POA: Insufficient documentation

## 2019-04-28 DIAGNOSIS — I10 Essential (primary) hypertension: Secondary | ICD-10-CM | POA: Diagnosis not present

## 2019-04-28 DIAGNOSIS — B349 Viral infection, unspecified: Secondary | ICD-10-CM | POA: Insufficient documentation

## 2019-04-28 DIAGNOSIS — Z7982 Long term (current) use of aspirin: Secondary | ICD-10-CM | POA: Diagnosis not present

## 2019-04-28 DIAGNOSIS — R0789 Other chest pain: Secondary | ICD-10-CM | POA: Diagnosis not present

## 2019-04-28 DIAGNOSIS — R0602 Shortness of breath: Secondary | ICD-10-CM | POA: Diagnosis present

## 2019-04-28 DIAGNOSIS — Z79899 Other long term (current) drug therapy: Secondary | ICD-10-CM | POA: Diagnosis not present

## 2019-04-28 LAB — CBC WITH DIFFERENTIAL/PLATELET
Abs Immature Granulocytes: 0.02 10*3/uL (ref 0.00–0.07)
Basophils Absolute: 0.1 10*3/uL (ref 0.0–0.1)
Basophils Relative: 1 %
Eosinophils Absolute: 0.5 10*3/uL (ref 0.0–0.5)
Eosinophils Relative: 7 %
HCT: 39.6 % (ref 36.0–46.0)
Hemoglobin: 12.9 g/dL (ref 12.0–15.0)
Immature Granulocytes: 0 %
Lymphocytes Relative: 32 %
Lymphs Abs: 2.2 10*3/uL (ref 0.7–4.0)
MCH: 29.6 pg (ref 26.0–34.0)
MCHC: 32.6 g/dL (ref 30.0–36.0)
MCV: 90.8 fL (ref 80.0–100.0)
Monocytes Absolute: 0.7 10*3/uL (ref 0.1–1.0)
Monocytes Relative: 11 %
Neutro Abs: 3.4 10*3/uL (ref 1.7–7.7)
Neutrophils Relative %: 49 %
Platelets: 242 10*3/uL (ref 150–400)
RBC: 4.36 MIL/uL (ref 3.87–5.11)
RDW: 14.3 % (ref 11.5–15.5)
WBC: 6.9 10*3/uL (ref 4.0–10.5)
nRBC: 0 % (ref 0.0–0.2)

## 2019-04-28 LAB — TSH: TSH: 2.012 u[IU]/mL (ref 0.350–4.500)

## 2019-04-28 LAB — BASIC METABOLIC PANEL
Anion gap: 11 (ref 5–15)
BUN: 17 mg/dL (ref 8–23)
CO2: 25 mmol/L (ref 22–32)
Calcium: 9.8 mg/dL (ref 8.9–10.3)
Chloride: 106 mmol/L (ref 98–111)
Creatinine, Ser: 1.19 mg/dL — ABNORMAL HIGH (ref 0.44–1.00)
GFR calc Af Amer: 55 mL/min — ABNORMAL LOW (ref >60)
GFR calc non Af Amer: 47 mL/min — ABNORMAL LOW (ref >60)
Glucose, Bld: 131 mg/dL — ABNORMAL HIGH (ref 70–99)
Potassium: 4.2 mmol/L (ref 3.5–5.1)
Sodium: 142 mmol/L (ref 135–145)

## 2019-04-28 LAB — TROPONIN I (HIGH SENSITIVITY): Troponin I (High Sensitivity): 8 ng/L (ref <18)

## 2019-04-28 MED ORDER — ALBUTEROL SULFATE HFA 108 (90 BASE) MCG/ACT IN AERS
2.0000 | INHALATION_SPRAY | Freq: Once | RESPIRATORY_TRACT | Status: AC
  Administered 2019-04-28: 2 via RESPIRATORY_TRACT
  Filled 2019-04-28: qty 6.7

## 2019-04-28 NOTE — ED Provider Notes (Signed)
[facility] EMERGENCY DEPARTMENT Provider Note   CSN: [id number] Arrival date & time: 04/28/19  1910     History Chief Complaint  Patient presents with  . Shortness of Breath    Wheezing    [name] [name] is a 68 y.o. female.  HPI [name] [name] is a 68 y.o. female with a medical history of anemia, arthritis, hyperlipidemia who presents to the ED for shortness of breath, intermittent chest pain and wheezing.  She reports the symptoms worsened today but she has had flulike symptoms for the past 3 months and she states she has had the symptoms ever since having the flu shot.  She reports shortness of breath at rest and feels like she has a little bit of wheezing.  She reports her chest pain is intermittent, 5 out of 10, nonradiating, sharp.  She tried taking albuterol with mild relief of symptoms.      Past Medical History:  Diagnosis Date  . Anemia   . Arthritis   . ASHD (arteriosclerotic heart disease)   . Blood in stool    GI consult 2012, EGD and colonoscopy  . Eczema   . GERD (gastroesophageal reflux disease)   . H/O bone density study 07/09/2006   osteopenia; ([facility])  . Hemorrhoid    internal and external, general surgery consult 04/2011  . History of echocardiogram 02/17/2008   normal LV structure and function w/ EF >55%, trace MR, TR and PI  . History of nuclear stress test 02/2008   no significant ischemia, low risk scan, post stress EF 68%  . Hyperlipidemia   . Hypertension   . Osteopenia     Patient Active Problem List   Diagnosis Date Noted  . Arthritis 01/28/2019  . OAB (overactive bladder) 01/28/2019  . Hx of adenomatous colonic polyps 12/13/2017  . Essential hypertension, benign 10/16/2011  . Hyperlipidemia 10/16/2011  . Osteopenia 10/16/2011  . Hemorrhoids 10/16/2011  . GERD (gastroesophageal reflux disease) 10/16/2011    Past Surgical History:  Procedure Laterality Date  . ABDOMINAL HYSTERECTOMY    . COLONOSCOPY   2012, 2008   Dr. Loreta Ave  . ESOPHAGOGASTRODUODENOSCOPY  2012, 2008   Dr. Loreta Ave     OB History   No obstetric history on file.     Family History  Problem Relation Age of Onset  . Heart disease Mother        Pacemaker  . Diabetes Mother   . Cancer Father        lung  . Diabetes Father   . Diabetes Sister   . Diabetes Brother   . Heart disease Brother        1 brother died MI age 54 yo  . Cancer Other        maternal side,non first degree breast cancer  . Stroke Neg Hx   . Hypertension Neg Hx   . Hyperlipidemia Neg Hx     Social History   Tobacco Use  . Smoking status: Never Smoker  . Smokeless tobacco: Never Used  Substance Use Topics  . Alcohol use: No  . Drug use: No    Home Medications Prior to Admission medications   Medication Sig Start Date End Date Taking? Authorizing Provider  Ascorbic Acid (VITAMIN C) 100 MG CHEW Vitamin C    [provider]  aspirin 81 MG tablet Take 81 mg by mouth daily.    [provider]  atorvastatin (LIPITOR) 40 MG tablet Take 1 tablet (40  mg total) by mouth daily. 01/28/19   Denita Lung, MD  Bimatoprost (LUMIGAN OP) Apply 1 drop to eye once. Only in left eye    [provider]  bisoprolol-hydrochlorothiazide (ZIAC) 5-6.25 MG tablet Take 2 tablets by mouth daily. 01/28/19   Denita Lung, MD  dexlansoprazole (DEXILANT) 60 MG capsule Take 1 capsule (60 mg total) by mouth daily. 01/28/19   Denita Lung, MD  docusate sodium (STOOL SOFTENER) 100 MG capsule Stool Softener    [provider]  felodipine (PLENDIL) 10 MG 24 hr tablet TAKE 1 TABLET(10 MG) BY MOUTH DAILY 01/28/19   Denita Lung, MD  FIBER PO Take 500 mg by mouth daily.    [provider]  fluconazole (DIFLUCAN) 150 MG tablet Take 1 tablet (150 mg total) by mouth daily. Patient not taking: Reported on 05/30/2018 04/29/18   Tysinger, Camelia Eng, PA-C  IRON PO Take 1,000 mg by mouth.    [provider]  Lifitegrast Shirley Friar  OP) Apply 1 drop to eye 2 (two) times daily at 10 AM and 5 PM.    [provider]  linaCLOtide (LINZESS PO) Take by mouth.    [provider]  Mirabegron (MYRBETRIQ PO) Take by mouth.    [provider]  Multiple Vitamin (MULTIVITAMIN) capsule Take 1 capsule by mouth daily.      [provider]  Nutritional Supplements (VITAMIN D BOOSTER PO) Take 1 capsule by mouth.    [provider]  Olopatadine HCl (PAZEO) 0.7 % SOLN Apply 1 drop to eye daily. Both Eyes    [provider]  Omega-3 Fatty Acids (FISH OIL PO) Take by mouth daily. 4 capsules daily    [provider]  predniSONE (DELTASONE) 10 MG tablet Take 2 tablets (20 mg total) by mouth 2 (two) times daily with a meal for 5 days. 04/29/19 05/04/19  Bethann Qualley, Lovena Le, MD  PREVIDENT 5000 ENAMEL PROTECT 1.1-5 % PSTE daily. 03/12/14   [provider]  traMADol (ULTRAM) 50 MG tablet Take 1 tablet (50 mg total) by mouth every 8 (eight) hours as needed. 06/13/18   Denita Lung, MD  vitamin E 400 UNIT capsule Take 400 Units by mouth daily.    [provider]    Allergies    Penicillins  Review of Systems   Review of Systems  Constitutional: Positive for fatigue. Negative for chills and fever.  HENT: Negative for ear pain and sore throat.   Eyes: Negative for pain and visual disturbance.  Respiratory: Positive for shortness of breath. Negative for cough.   Cardiovascular: Positive for chest pain. Negative for palpitations.  Gastrointestinal: Negative for abdominal pain and vomiting.  Genitourinary: Negative for dysuria and hematuria.  Musculoskeletal: Negative for arthralgias and back pain.  Skin: Negative for color change and rash.  Neurological: Negative for seizures and syncope.  All other systems reviewed and are negative.   Physical Exam Updated Vital Signs BP (!) 154/88   Pulse (!) 55   Temp 98.6 F (37 C) (Oral)   Resp (!) 22   SpO2 95%   Physical  Exam Vitals and nursing note reviewed.  Constitutional:      General: She is not in acute distress.    Appearance: Normal appearance. She is well-developed. She is not ill-appearing.  HENT:     Head: Normocephalic and atraumatic.     Right Ear: External ear normal.     Left Ear: External ear normal.  Nose: Nose normal. No rhinorrhea.     Mouth/Throat:     Mouth: Mucous membranes are moist.  Eyes:     General:        Right eye: No discharge.        Left eye: No discharge.     Conjunctiva/sclera: Conjunctivae normal.  Cardiovascular:     Rate and Rhythm: Normal rate and regular rhythm.     Pulses: Normal pulses.     Heart sounds: Normal heart sounds. No murmur.  Pulmonary:     Effort: Pulmonary effort is normal. No respiratory distress.     Breath sounds: Wheezing present. No rales.     Comments: Mild bilateral expiratory wheezing, no respiratory distress, no conversational dyspnea Abdominal:     General: Abdomen is flat. There is no distension.     Palpations: Abdomen is soft.     Tenderness: There is no abdominal tenderness.  Musculoskeletal:        General: No deformity or signs of injury. Normal range of motion.     Cervical back: Normal range of motion and neck supple.  Skin:    General: Skin is warm and dry.     Capillary Refill: Capillary refill takes less than 2 seconds.     Coloration: Skin is not jaundiced.  Neurological:     General: No focal deficit present.     Mental Status: She is alert. Mental status is at baseline.  Psychiatric:        Mood and Affect: Mood normal.        Behavior: Behavior normal.     ED Results / Procedures / Treatments   Labs (all labs ordered are listed, but only abnormal results are displayed) Labs Reviewed  BASIC METABOLIC PANEL - Abnormal; Notable for the following components:      Result Value   Glucose, Bld 131 (*)    Creatinine, Ser 1.19 (*)    GFR calc non Af Amer 47 (*)    GFR calc Af Amer 55 (*)    All other  components within normal limits  NOVEL CORONAVIRUS, NAA (HOSP ORDER, SEND-OUT TO REF LAB; TAT 18-24 HRS)  CBC WITH DIFFERENTIAL/PLATELET  TSH  TROPONIN I (HIGH SENSITIVITY)  TROPONIN I (HIGH SENSITIVITY)    EKG EKG Interpretation  Date/Time:  Tuesday April 28 2019 21:37:31 EST Ventricular Rate:  78 PR Interval:    QRS Duration: 87 QT Interval:  397 QTC Calculation: 453 R Axis:   38 Text Interpretation: Sinus rhythm Abnormal R-wave progression, early transition When compared to prior, no significant changes seen. NO STEMI Confirmed by Theda Belfastegeler, Chris ([id number]) on 04/28/2019 9:38:39 PM Also confirmed by Theda Belfastegeler, Chris ([id number]), editor Elita QuickWatlington, [place] [phone])  on 04/29/2019 8:45:51 AM   Radiology DG Chest 2 View  Result Date: 04/28/2019 CLINICAL DATA:  Shortness of breath EXAM: CHEST - 2 VIEW COMPARISON:  04/28/2018 FINDINGS: Minimal enlargement of cardiac silhouette. Mediastinal contours and pulmonary vascularity normal. Atherosclerotic calcification aorta. Lungs clear. Prominent first costochondral junctions bilaterally unchanged. No acute infiltrate, pleural effusion or pneumothorax. Bones demineralized. IMPRESSION: Minimal enlargement of cardiac silhouette. No acute abnormalities. Aortic Atherosclerosis (ICD10-I70.0). Electronically Signed   By: Ulyses SouthwardMark  Boles M.D.   On: 04/28/2019 19:59    Procedures Procedures (including critical care time)  Medications Ordered in ED Medications  albuterol (VENTOLIN HFA) 108 (90 Base) MCG/ACT inhaler 2 puff (2 puffs Inhalation Given 04/28/19 1928)    ED Course  I have reviewed the triage vital signs and the nursing  notes.  Pertinent labs & imaging results that were available during my care of the patient were reviewed by me and considered in my medical decision making (see chart for details).    MDM Rules/Calculators/A&P                      [name] [name] is a 68 y.o. female with a medical history of anemia, arthritis, hyperlipidemia  who presents to the ED for shortness of breath, intermittent chest pain and wheezing.  She reports the symptoms worsened today but she has had flulike symptoms for the past 3 months and she states she has had the symptoms ever since having the flu shot.   HPI and physical exam as above. She presents awake, alert, hemodynamically stable, afebrile, non toxic. Low suspicion for PE at this time (she is well appearing, no distress on exam, no chest pain at this time, no hypoxia, tachypnea or tachycardia), acs (x2 troponin negative, non ischemic ecg), pneumonia (cxr unremarkable). Her symptoms may be due to reactive airway disease. We started her on a short course of steroids and provided her with an extra albuterol inhaler. She is stable and ready for discharge Discussed plan with patient and family including strict return precautions and follow up with PCP. Patient and family understand and are amenable with plan.    Final Clinical Impression(s) / ED Diagnoses Final diagnoses:  Viral syndrome    Rx / DC Orders ED Discharge Orders         Ordered    predniSONE (DELTASONE) 10 MG tablet  2 times daily with meals     04/29/19 0036           Jodette Wik, Ladona Ridgel, MD 04/29/19 1757    Tegeler, Canary Brim, MD 04/30/19 [phone]

## 2019-04-28 NOTE — ED Triage Notes (Signed)
Patient reports SOB with wheezing and occasional dry cough this week , denies fever or chills .

## 2019-04-29 LAB — TROPONIN I (HIGH SENSITIVITY): Troponin I (High Sensitivity): 8 ng/L (ref <18)

## 2019-04-29 MED ORDER — PREDNISONE 10 MG PO TABS: 20.0000 mg | ORAL_TABLET | Freq: Two times a day (BID) | ORAL | 0 refills | Status: AC

## 2019-04-30 LAB — NOVEL CORONAVIRUS, NAA (HOSP ORDER, SEND-OUT TO REF LAB; TAT 18-24 HRS): SARS-CoV-2, NAA: NOT DETECTED

## 2019-05-05 ENCOUNTER — Other Ambulatory Visit: Payer: Self-pay

## 2019-05-05 ENCOUNTER — Ambulatory Visit
Admission: RE | Admit: 2019-05-05 | Discharge: 2019-05-05 | Disposition: A | Discharge status: Home / Self Care | Payer: Medicare Other | Source: Ambulatory Visit | Attending: Family Medicine | Admitting: Family Medicine

## 2019-05-14 NOTE — Progress Notes (Signed)
Cardiology Office Note   Date:  05/15/2019   ID:  [name] [name], [name] [name] Apr 29, 1951, MRN [id number]  PCP:  Ronnald Nian, MD  Cardiologist:   No primary care provider on file. Referring:  Ronnald Nian, MD  Chief Complaint  Patient presents with  . Shortness of Breath      History of Present Illness: [name] [name] is a 69 y.o. female who presents for evaluation of shortness of breath.  She had a normal echo in 2006.  She had a cardiac catheterization in 2006 with 40-50% proximal LAD stenosis and mild plaquing elsewhere I saw her in 2015 for evaluation of SOB.  She had a perfusion study in 2015 that was normal.    She said that she had bronchitis about 3 weeks ago.  She had a viral upper respiratory infection.  She was Covid negative.  Prior to that she was getting short of breath slowly over time doing activities such as carrying in groceries.  Its probably been a little worse since the viral illness.  She will have to stop what she is doing and it takes a few minutes to recover.  She is not having any cough fevers or chills.  She is not having any chest pressure, neck or arm discomfort.  She has had a progressive weight gain since the pandemic started.  She is not having any new PND or orthopnea.  She denies any palpitations, presyncope or syncope.   Past Medical History:  Diagnosis Date  . Anemia   . Arthritis   . ASHD (arteriosclerotic heart disease)   . Blood in stool    GI consult 2012, EGD and colonoscopy  . Eczema   . GERD (gastroesophageal reflux disease)   . H/O bone density study 07/09/2006   osteopenia; ([facility]  . Hemorrhoid    internal and external, general surgery consult 04/2011  . History of echocardiogram 02/17/2008   normal LV structure and function w/ EF >55%, trace MR, TR and PI  . History of nuclear stress test 02/2008   no significant ischemia, low risk scan, post stress EF 68%  . Hyperlipidemia   . Hypertension   . Osteopenia     Past  Surgical History:  Procedure Laterality Date  . ABDOMINAL HYSTERECTOMY    . COLONOSCOPY  2012, 2008   Dr. Loreta Ave  . ESOPHAGOGASTRODUODENOSCOPY  2012, 2008   Dr. Loreta Ave     Current Outpatient Medications  Medication Sig Dispense Refill  . Ascorbic Acid (VITAMIN C) 100 MG CHEW Vitamin C    . aspirin 81 MG tablet Take 81 mg by mouth daily.    Marland Kitchen atorvastatin (LIPITOR) 80 MG tablet Take 1 tablet (80 mg total) by mouth daily. 90 tablet 3  . Bimatoprost (LUMIGAN OP) Apply 1 drop to eye once. Only in left eye    . bisoprolol-hydrochlorothiazide (ZIAC) 5-6.25 MG tablet Take 2 tablets by mouth daily. 180 tablet 1  . dexlansoprazole (DEXILANT) 60 MG capsule Take 1 capsule (60 mg total) by mouth daily. 90 capsule 3  . docusate sodium (STOOL SOFTENER) 100 MG capsule Stool Softener    . felodipine (PLENDIL) 10 MG 24 hr tablet TAKE 1 TABLET(10 MG) BY MOUTH DAILY 90 tablet 3  . FIBER PO Take 500 mg by mouth daily.    . IRON PO Take 1,000 mg by mouth.    Marland Kitchen Lifitegrast (XIIDRA OP) Apply 1 drop to eye 2 (two) times daily at 10 AM and  5 PM.    . Multiple Vitamin (MULTIVITAMIN) capsule Take 1 capsule by mouth daily.      . Olopatadine HCl (PAZEO) 0.7 % SOLN Apply 1 drop to eye daily. Both Eyes    . Omega-3 Fatty Acids (FISH OIL PO) Take by mouth daily. 4 capsules daily    . PREVIDENT 5000 ENAMEL PROTECT 1.1-5 % PSTE daily.  0  . traMADol (ULTRAM) 50 MG tablet Take 1 tablet (50 mg total) by mouth every 8 (eight) hours as needed. 30 tablet 0  . vitamin E 400 UNIT capsule Take 400 Units by mouth daily.    . solifenacin (VESICARE) 5 MG tablet Take 5 mg by mouth daily.     No current facility-administered medications for this visit.    Allergies:   Crestor [rosuvastatin] and Penicillins    Social History:  The patient  reports that she has never smoked. She has never used smokeless tobacco. She reports that she does not drink alcohol or use drugs.   Family History:  The patient's family history includes  Cancer in her father and another family member; Diabetes in her brother, father, mother, and sister; Heart disease in her brother and mother.    ROS:  Please see the history of present illness.   Otherwise, review of systems are positive for none.   All other systems are reviewed and negative.    PHYSICAL EXAM: VS:  BP (!) 153/90   Pulse 60   Temp (!) 93 F (33.9 C)   Ht 5\' 5"  (1.651 m)   Wt 223 lb 4.8 oz (101.3 kg)   SpO2 95%   BMI 37.16 kg/m  , BMI Body mass index is 37.16 kg/m. GENERAL:  Well appearing HEENT:  Pupils equal round and reactive, fundi not visualized, oral mucosa unremarkable NECK:  No jugular venous distention, waveform within normal limits, carotid upstroke brisk and symmetric, no bruits, no thyromegaly LYMPHATICS:  No cervical, inguinal adenopathy LUNGS:  Clear to auscultation bilaterally BACK:  No CVA tenderness CHEST:  Unremarkable HEART:  PMI not displaced or sustained,S1 and S2 within normal limits, no S3, no S4, no clicks, no rubs, no murmurs ABD:  Flat, positive bowel sounds normal in frequency in pitch, no bruits, no rebound, no guarding, no midline pulsatile mass, no hepatomegaly, no splenomegaly EXT:  2 plus pulses throughout, no edema, no cyanosis no clubbing SKIN:  No rashes no nodules NEURO:  Cranial nerves II through XII grossly intact, motor grossly intact throughout PSYCH:  Cognitively intact, oriented to person place and time    EKG:  EKG is ordered today. The ekg ordered today demonstrates sinus rhythm, rate 60, axis within normal limits, intervals within normal limits, no acute ST-T wave changes.   Recent Labs: 01/28/2019: ALT 22 04/28/2019: BUN 17; Creatinine, Ser 1.19; Hemoglobin 12.9; Platelets 242; Potassium 4.2; Sodium 142; TSH 2.012    Lipid Panel    Component Value Date/Time   CHOL 201 (H) 01/28/2019 1336   TRIG 122 01/28/2019 1336   HDL 66 01/28/2019 1336   CHOLHDL 3.0 01/28/2019 1336   CHOLHDL 2.8 11/09/2016 0850   VLDL 29  11/09/2016 0850   LDLCALC 114 (H) 01/28/2019 1336      Wt Readings from Last 3 Encounters:  05/15/19 223 lb 4.8 oz (101.3 kg)  03/12/19 213 lb (96.6 kg)  01/28/19 213 lb 3.2 oz (96.7 kg)      Other studies Reviewed: Additional studies/ records that were reviewed today include: Previous cardiac records. ED  notes from 12/22 Review of the above records demonstrates:  Please see elsewhere in the note.     ASSESSMENT AND PLAN:  DYSPNEA: Her dyspnea might be multifactorial but I need to exclude obstructive coronary disease.  See below.  CAD: She needs to be screened for progression of her coronary disease but I do not know that she will be able walk on a treadmill.  She will have a [employer].  WEIGHT:   We talked at length about diet and exercise.   HTN: The blood pressure is not at target.  He wants to defer on higher medications and promises to keep a blood pressure diary and lose weight and perhaps we will not have to uptitrate.   DYSLIPIDEMIA: She is not at target with her LDL of 114 so I am going to increase her Lipitor to 80 mg daily and get a lipid profile liver enzymes in 10 weeks.  COVID EDUCATION: She is not crazy about getting the vaccine but I did answer all her questions.  She will consider it.    Current medicines are reviewed at length with the patient today.  The patient does not have concerns regarding medicines.  The following changes have been made:  no change  Labs/ tests ordered today include:   Orders Placed This Encounter  Procedures  . Lipid Profile  . Comprehensive Metabolic Panel (CMET)  . Myocardial Perfusion Imaging  . EKG 12-Lead     Disposition:   FU with me in six months or sooner is symptoms progress    Signed, Rollene Rotunda, MD  05/15/2019 11:53 AM    [facility]

## 2019-05-15 ENCOUNTER — Other Ambulatory Visit: Payer: Self-pay

## 2019-05-15 ENCOUNTER — Encounter: Payer: Self-pay | Admitting: Cardiology

## 2019-05-15 ENCOUNTER — Ambulatory Visit: Payer: Medicare Other | Admitting: Cardiology

## 2019-05-15 VITALS — BP 153/90 | HR 60 | Temp 93.0°F | Ht 65.0 in | Wt 223.3 lb

## 2019-05-15 DIAGNOSIS — Z5181 Encounter for therapeutic drug level monitoring: Secondary | ICD-10-CM

## 2019-05-15 DIAGNOSIS — I10 Essential (primary) hypertension: Secondary | ICD-10-CM

## 2019-05-15 DIAGNOSIS — R0600 Dyspnea, unspecified: Secondary | ICD-10-CM

## 2019-05-15 DIAGNOSIS — E785 Hyperlipidemia, unspecified: Secondary | ICD-10-CM

## 2019-05-15 MED ORDER — ATORVASTATIN CALCIUM 80 MG PO TABS: 80.0000 mg | ORAL_TABLET | Freq: Every day | ORAL | 3 refills | Status: DC

## 2019-05-15 NOTE — Patient Instructions (Signed)
Medication Instructions:  INCREASE YOUR ATORVASTATIN TO 80 MG DAILY   *If you need a refill on your cardiac medications before your next appointment, please call your pharmacy*  Lab Work: FASTING LP/CMET IN 10 WEEKS  If you have labs (blood work) drawn today and your tests are completely normal, you will receive your results only by: Marland Kitchen MyChart Message (if you have MyChart) OR . A paper copy in the mail If you have any lab test that is abnormal or we need to change your treatment, we will call you to review the results.  Testing/Procedures: Your physician has requested that you have a lexiscan myoview. For further information please visit [email or web address] Please follow instruction sheet, as given.  Follow-Up: At [facility], you and your health needs are our priority.  As part of our continuing mission to provide you with exceptional heart care, we have created designated Provider Care Teams.  These Care Teams include your primary Cardiologist (physician) and Advanced Practice Providers (APPs -  Physician Assistants and Nurse Practitioners) who all work together to provide you with the care you need, when you need it.  Your next appointment:   6 month(s)  The format for your next appointment:   Either In Person or Virtual  Provider:   You may see DR Antoine Poche  or one of the following Advanced Practice Providers on your designated Care Team:    Theodore Demark, PA-C  Joni Reining, DNP, ANP  Cadence Fransico Michael, NP   Other Instructions   Cardiac Nuclear Scan A cardiac nuclear scan is a test that is done to check the flow of blood to your heart. It is done when you are resting and when you are exercising. The test looks for problems such as:  Not enough blood reaching a portion of the heart.  The heart muscle not working as it should. You may need this test if:  You have heart disease.  You have had lab results that are not normal.  You have had heart surgery or a  balloon procedure to open up blocked arteries (angioplasty).  You have chest pain.  You have shortness of breath. In this test, a special dye (tracer) is put into your bloodstream. The tracer will travel to your heart. A camera will then take pictures of your heart to see how the tracer moves through your heart. This test is usually done at a hospital and takes 2-4 hours. Tell a doctor about:  Any allergies you have.  All medicines you are taking, including vitamins, herbs, eye drops, creams, and over-the-counter medicines.  Any problems you or family members have had with anesthetic medicines.  Any blood disorders you have.  Any surgeries you have had.  Any medical conditions you have.  Whether you are pregnant or may be pregnant. What are the risks? Generally, this is a safe test. However, problems may occur, such as:  Serious chest pain and heart attack. This is only a risk if the stress portion of the test is done.  Rapid heartbeat.  A feeling of warmth in your chest. This feeling usually does not last long.  Allergic reaction to the tracer. What happens before the test?  Ask your doctor about changing or stopping your normal medicines. This is important.  Follow instructions from your doctor about what you cannot eat or drink.  Remove your jewelry on the day of the test. What happens during the test?  An IV tube will be inserted into one of your veins.  Your doctor will give you a small amount of tracer through the IV tube.  You will wait for 20-40 minutes while the tracer moves through your bloodstream.  Your heart will be monitored with an electrocardiogram (ECG).  You will lie down on an exam table.  Pictures of your heart will be taken for about 15-20 minutes.  You may also have a stress test. For this test, one of these things may be done: ? You will be asked to exercise on a treadmill or a stationary bike. ? You will be given medicines that will make  your heart work harder. This is done if you are unable to exercise.  When blood flow to your heart has peaked, a tracer will again be given through the IV tube.  After 20-40 minutes, you will get back on the exam table. More pictures will be taken of your heart.  Depending on the tracer that is used, more pictures may need to be taken 3-4 hours later.  Your IV tube will be removed when the test is over. The test may vary among doctors and hospitals. What happens after the test?  Ask your doctor: ? Whether you can return to your normal schedule, including diet, activities, and medicines. ? Whether you should drink more fluids. This will help to remove the tracer from your body. Drink enough fluid to keep your pee (urine) pale yellow.  Ask your doctor, or the department that is doing the test: ? When will my results be ready? ? How will I get my results? Summary  A cardiac nuclear scan is a test that is done to check the flow of blood to your heart.  Tell your doctor whether you are pregnant or may be pregnant.  Before the test, ask your doctor about changing or stopping your normal medicines. This is important.  Ask your doctor whether you can return to your normal activities. You may be asked to drink more fluids. This information is not intended to replace advice given to you by your health care provider. Make sure you discuss any questions you have with your health care provider. Document Revised: 08/13/2018 Document Reviewed: 10/07/2017 Elsevier Patient Education  [place].

## 2019-05-22 ENCOUNTER — Telehealth (HOSPITAL_COMMUNITY): Payer: Self-pay | Admitting: *Deleted

## 2019-05-22 NOTE — Telephone Encounter (Signed)
Close encounter 

## 2019-05-26 ENCOUNTER — Telehealth (HOSPITAL_COMMUNITY): Payer: Self-pay

## 2019-05-26 ENCOUNTER — Ambulatory Visit (HOSPITAL_COMMUNITY): Payer: Medicare PPO

## 2019-05-26 NOTE — Telephone Encounter (Signed)
Encounter complete. 

## 2019-05-27 ENCOUNTER — Other Ambulatory Visit: Payer: Self-pay

## 2019-05-27 ENCOUNTER — Ambulatory Visit (HOSPITAL_COMMUNITY)
Admission: RE | Admit: 2019-05-27 | Discharge: 2019-05-27 | Disposition: A | Discharge status: Home / Self Care | Payer: Medicare PPO | Source: Ambulatory Visit | Attending: Cardiology | Admitting: Cardiology

## 2019-05-27 DIAGNOSIS — I10 Essential (primary) hypertension: Secondary | ICD-10-CM

## 2019-05-27 DIAGNOSIS — R0600 Dyspnea, unspecified: Secondary | ICD-10-CM

## 2019-05-27 MED ORDER — TECHNETIUM TC 99M TETROFOSMIN IV KIT
30.4000 | PACK | Freq: Once | INTRAVENOUS | Status: AC | PRN
  Administered 2019-05-27: 30.4 via INTRAVENOUS
  Filled 2019-05-27: qty 31

## 2019-05-27 MED ORDER — REGADENOSON 0.4 MG/5ML IV SOLN
0.4000 mg | Freq: Once | INTRAVENOUS | Status: AC
  Administered 2019-05-27: 0.4 mg via INTRAVENOUS

## 2019-05-28 ENCOUNTER — Ambulatory Visit (HOSPITAL_COMMUNITY)
Admission: RE | Admit: 2019-05-28 | Discharge: 2019-05-28 | Disposition: A | Discharge status: Home / Self Care | Payer: Medicare PPO | Source: Ambulatory Visit | Attending: Cardiology | Admitting: Cardiology

## 2019-05-28 LAB — MYOCARDIAL PERFUSION IMAGING
LV dias vol: 96 mL (ref 46–106)
LV sys vol: 37 mL
Peak HR: 76 {beats}/min
Rest HR: 53 {beats}/min
SDS: 1
SRS: 0
SSS: 1
TID: 0.91

## 2019-05-28 MED ORDER — TECHNETIUM TC 99M TETROFOSMIN IV KIT
29.2000 | PACK | Freq: Once | INTRAVENOUS | Status: AC | PRN
  Administered 2019-05-28: 29.2 via INTRAVENOUS

## 2019-06-12 DIAGNOSIS — H401221 Low-tension glaucoma, left eye, mild stage: Secondary | ICD-10-CM | POA: Diagnosis not present

## 2019-06-18 ENCOUNTER — Encounter: Payer: Self-pay | Admitting: Pulmonary Disease

## 2019-06-18 ENCOUNTER — Ambulatory Visit: Payer: Medicare PPO | Admitting: Pulmonary Disease

## 2019-06-18 ENCOUNTER — Other Ambulatory Visit: Payer: Self-pay

## 2019-06-18 VITALS — BP 130/80 | HR 62 | Temp 97.0°F | Ht 65.0 in | Wt 223.4 lb

## 2019-06-18 DIAGNOSIS — R5383 Other fatigue: Secondary | ICD-10-CM

## 2019-06-18 DIAGNOSIS — R0600 Dyspnea, unspecified: Secondary | ICD-10-CM

## 2019-06-18 MED ORDER — PREDNISONE 10 MG PO TABS: ORAL_TABLET | ORAL | 0 refills | Status: DC

## 2019-06-18 MED ORDER — ALBUTEROL SULFATE HFA 108 (90 BASE) MCG/ACT IN AERS: 2.0000 | INHALATION_SPRAY | Freq: Four times a day (QID) | RESPIRATORY_TRACT | 11 refills | Status: AC | PRN

## 2019-06-18 NOTE — Progress Notes (Signed)
Subjective:    Patient ID: [name] [name], female    DOB: 11-May-1950, 69 y.o.   MRN: [id number]  Patient with complaints of fatigue, shortness of breath on exertion  Symptoms of been persistent since about mid October  She had similar symptoms about a year ago following obtaining a flu shot .  She stated she required 3 rounds of antibiotics and steroids before symptoms finally settled down about a year ago   Recently about September she saw PCP for routine follow-up, she stated she was encouraged to get a flu shot on following obtaining a flu shot she started feeling poorly Bronchitis  .  Steroids and albuterol did help symptoms  .  Symptoms started about October with shortness of breath and fatigue .  Followed up with her primary doctor .  December 22 she ended up in the emergency department where she was diagnosed with a viral URI, Covid negative  .  Has had persistent symptoms since then  .  She does have wheezing, shortness of breath, significant fatigue  .  She does have a history of reflux  .  Recently had a cardiac work-up done she was told that her heart was doing okay.  .  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 Anemia   . Arthritis   . ASHD (arteriosclerotic heart disease)   . Blood in stool    GI consult 2012, EGD and colonoscopy  . Eczema   . GERD (gastroesophageal reflux disease)   . H/O bone density study 07/09/2006   [facility]; [facility]  . Hemorrhoid    internal and external, general surgery consult 04/2011  . History of echocardiogram 02/17/2008   normal LV structure and function w/ EF >55%, trace MR, TR and PI  . History of nuclear stress test 02/2008   no significant ischemia, low risk scan, post stress EF 68%  . Hyperlipidemia   . Hypertension   . Osteopenia    Social History   Socioeconomic History  . Marital status: Married    Spouse name: Not on file  . Number of children: Not on file    . Years of education: Not on file  . Highest education level: Not on file  Occupational History  . Occupation: [employer]    Comment: Retired   Tobacco Use  . Smoking status: Never Smoker  . Smokeless tobacco: Never Used  Substance and Sexual Activity  . Alcohol use: No  . Drug use: No  . Sexual activity: Not Currently  Other Topics Concern  . Not on file  Social History Narrative   Widow.  Retired from [employer].   Three grand.     Social Determinants of Health   Financial Resource Strain:   . Difficulty of Paying Living Expenses: Not on file  Food Insecurity:   . Worried About Programme researcher, broadcasting/film/video in the Last Year: Not on file  . Ran Out of Food in the Last Year: Not on file  Transportation Needs:   . Lack of Transportation (Medical): Not on file  . Lack of Transportation (Non-Medical): Not on file  Physical Activity:   . Days of Exercise per Week: Not on file  . Minutes of Exercise per Session: Not on file  Stress:   . Feeling of Stress : Not on file  Social Connections:   . Frequency of Communication with Friends and Family: Not on file  .  Frequency of Social Gatherings with Friends and Family: Not on file  . Attends Religious Services: Not on file  . Active Member of Clubs or Organizations: Not on file  . Attends Banker Meetings: Not on file  . Marital Status: Not on file  Intimate Partner Violence:   . Fear of Current or Ex-Partner: Not on file  . Emotionally Abused: Not on file  . Physically Abused: Not on file  . Sexually Abused: Not on file   Family History  Problem Relation Age of Onset  . Heart disease Mother        Pacemaker  . Diabetes Mother   . Cancer Father        lung  . Diabetes Father   . Diabetes Sister   . Heart disease Sister   . Hypertension Sister   . Diabetes Brother   . Heart disease Brother        1 brother died MI age 40 yo  . Hypertension Brother   . Stroke Brother   . Cancer Other        maternal side,non first  degree breast cancer  . Hypertension Brother   . Diabetes Brother   . Heart disease Brother   . Heart disease Brother   . Hypertension Brother   . Diabetes Brother   . Hypertension Sister   . Hypertension Sister   . Diabetes Sister   . Diabetes Sister   . Hypertension Sister   . Hypertension Sister   . Diabetes Sister    Review of Systems  HENT: Positive for ear pain.   Respiratory: Positive for shortness of breath and wheezing.       Objective:   Physical Exam Constitutional:      Appearance: She is obese.  HENT:     Head: Normocephalic and atraumatic.     Nose: Nose normal. No congestion or rhinorrhea.     Mouth/Throat:     Mouth: Mucous membranes are moist.  Eyes:     General:        Right eye: No discharge.        Left eye: No discharge.     Pupils: Pupils are equal, round, and reactive to light.  Cardiovascular:     Rate and Rhythm: Normal rate and regular rhythm.     Pulses: Normal pulses.     Heart sounds: Normal heart sounds. No murmur.  Pulmonary:     Effort: Pulmonary effort is normal. No respiratory distress.     Breath sounds: Normal breath sounds. No stridor. No wheezing or rhonchi.  Musculoskeletal:        General: No swelling. Normal range of motion.     Cervical back: Normal range of motion and neck supple. No rigidity or tenderness.  Skin:    General: Skin is warm.  Neurological:     General: No focal deficit present.     Mental Status: She is alert.  Psychiatric:        Mood and Affect: Mood normal.     Vitals:   06/18/19 1511  BP: 130/80  Pulse: 62  Temp: (!) 97 F (36.1 C)  SpO2: 95%   Myocardial perfusion scan 05/28/2019-no evidence of ischemia Chest x-ray 04/28/2019-no acute infiltrate-reviewed by myself     Assessment & Plan:  .  Persistent shortness of breath and fatigue .  She did describe improvement in symptoms with nasal steroids.  .  Denies a history of underlying lung disease  .  We will obtain some blood work  including thyroid function test, CBC, Chem-7  .  Obtain a pulmonary function test  .  Continue albuterol as needed  .  Short course of prednisone 10 p.o. twice daily for 7 days then 10 mg p.o. daily for 7 days  .  Encouraged to call with any significant concerns  I will follow-up with her in the office in about 4 to 6 weeks

## 2019-06-18 NOTE — Patient Instructions (Signed)
Shortness of breath Fatigue  Some blood work Thyroid function test CBC Chem-7  Breathing study  Albuterol as needed Short course of prednisone  Call with any significant concerns  We will see you back in about 4 weeks

## 2019-06-19 DIAGNOSIS — N3941 Urge incontinence: Secondary | ICD-10-CM | POA: Diagnosis not present

## 2019-06-19 LAB — BASIC METABOLIC PANEL
BUN: 21 mg/dL (ref 6–23)
CO2: 28 mEq/L (ref 19–32)
Calcium: 9.9 mg/dL (ref 8.4–10.5)
Chloride: 104 mEq/L (ref 96–112)
Creatinine, Ser: 1.17 mg/dL (ref 0.40–1.20)
GFR: 55.68 mL/min — ABNORMAL LOW (ref >60.00)
Glucose, Bld: 98 mg/dL (ref 70–99)
Potassium: 4.4 mEq/L (ref 3.5–5.1)
Sodium: 140 mEq/L (ref 135–145)

## 2019-06-19 LAB — CBC WITH DIFFERENTIAL/PLATELET
Basophils Absolute: 0.1 10*3/uL (ref 0.0–0.1)
Basophils Relative: 1 % (ref 0.0–3.0)
Eosinophils Absolute: 0.4 10*3/uL (ref 0.0–0.7)
Eosinophils Relative: 6.9 % — ABNORMAL HIGH (ref 0.0–5.0)
HCT: 39.5 % (ref 36.0–46.0)
Hemoglobin: 12.9 g/dL (ref 12.0–15.0)
Lymphocytes Relative: 34.7 % (ref 12.0–46.0)
Lymphs Abs: 2.2 10*3/uL (ref 0.7–4.0)
MCHC: 32.7 g/dL (ref 30.0–36.0)
MCV: 91.2 fl (ref 78.0–100.0)
Monocytes Absolute: 0.8 10*3/uL (ref 0.1–1.0)
Monocytes Relative: 13.4 % — ABNORMAL HIGH (ref 3.0–12.0)
Neutro Abs: 2.8 10*3/uL (ref 1.4–7.7)
Neutrophils Relative %: 44 % (ref 43.0–77.0)
Platelets: 217 10*3/uL (ref 150.0–400.0)
RBC: 4.33 Mil/uL (ref 3.87–5.11)
RDW: 15 % (ref 11.5–15.5)
WBC: 6.3 10*3/uL (ref 4.0–10.5)

## 2019-06-23 ENCOUNTER — Telehealth: Payer: Self-pay | Admitting: Pulmonary Disease

## 2019-06-23 NOTE — Telephone Encounter (Signed)
Called and spoke with pt. Pt is requesting the results of labwork which was performed 2/11. Dr. Wynona Neat please advise on this for pt.

## 2019-06-28 LAB — TSH+T3+FREE T4+T3 FREE
Free T-3: 2.7 pg/mL
Free T4 by Dialysis: 0.7 ng/dL — ABNORMAL LOW
TSH: 1.4 uU/mL
Triiodothyronine (T-3), Serum: 134 ng/dL

## 2019-06-29 NOTE — Telephone Encounter (Signed)
Pt calling back regarding results. Pt getting anxious and requesting results. Pt can be reached at [phone].  Will forward to Dr. Wynona Neat to advise on lab results. Thanks.

## 2019-07-01 NOTE — Telephone Encounter (Signed)
Spoke with pt and notified of results per Dr. Olalere. Pt verbalized understanding and denied any questions.  

## 2019-07-01 NOTE — Telephone Encounter (Signed)
Blood work within normal limits   Normal CBC Normal electrolytes Normal thyroid functions

## 2019-07-07 ENCOUNTER — Telehealth: Payer: Self-pay

## 2019-07-07 MED ORDER — OMEPRAZOLE 40 MG PO CPDR: 40.0000 mg | DELAYED_RELEASE_CAPSULE | Freq: Every day | ORAL | 3 refills | Status: DC

## 2019-07-07 NOTE — Addendum Note (Signed)
Addended by: Ronnald Nian on: 07/07/2019 04:43 PM   Modules accepted: Orders

## 2019-07-07 NOTE — Telephone Encounter (Signed)
Pt advised that she would like to try omeprazole because the over the counter medicine doesd not work . Please advise if you would start her on this per [facility]. K[facility]

## 2019-07-08 ENCOUNTER — Telehealth: Payer: Self-pay

## 2019-07-08 NOTE — Telephone Encounter (Signed)
I submitted and a PA and a tier exception review and per [facility] it was approved from now until 05/06/2020.

## 2019-07-20 ENCOUNTER — Encounter: Payer: Self-pay | Admitting: Pulmonary Disease

## 2019-07-20 ENCOUNTER — Encounter (HOSPITAL_BASED_OUTPATIENT_CLINIC_OR_DEPARTMENT_OTHER): Payer: Self-pay

## 2019-07-20 ENCOUNTER — Other Ambulatory Visit: Payer: Self-pay

## 2019-07-20 ENCOUNTER — Ambulatory Visit (HOSPITAL_BASED_OUTPATIENT_CLINIC_OR_DEPARTMENT_OTHER)
Admission: RE | Admit: 2019-07-20 | Discharge: 2019-07-20 | Disposition: A | Discharge status: Home / Self Care | Payer: Medicare PPO | Source: Ambulatory Visit | Attending: Pulmonary Disease | Admitting: Pulmonary Disease

## 2019-07-20 ENCOUNTER — Ambulatory Visit: Payer: Medicare PPO | Admitting: Pulmonary Disease

## 2019-07-20 DIAGNOSIS — R0602 Shortness of breath: Secondary | ICD-10-CM | POA: Diagnosis not present

## 2019-07-20 MED ORDER — IOHEXOL 350 MG/ML SOLN
100.0000 mL | Freq: Once | INTRAVENOUS | Status: AC | PRN
  Administered 2019-07-20: 100 mL via INTRAVENOUS

## 2019-07-20 MED ORDER — BREO ELLIPTA 100-25 MCG/INH IN AEPB: 1.0000 | INHALATION_SPRAY | Freq: Every day | RESPIRATORY_TRACT | 0 refills | Status: DC

## 2019-07-20 NOTE — Progress Notes (Signed)
Subjective:    Patient ID: [name] [name], female    DOB: 09/19/1950, 69 y.o.   MRN: [id number]  Patient with complaints of fatigue, shortness of breath on exertion  Shortness of breath does not appear to be any better Shortness of breath with minimal exertion  Shortness of breath with going up steps Even on level ground with mild exertion Occasional wheezing  Symptoms of been persistent since about mid October  She had similar symptoms about a year ago following obtaining a flu shot .  She stated she required 3 rounds of antibiotics and steroids before symptoms finally settled down about a year ago   Recently about September she saw PCP for routine follow-up, she stated she was encouraged to get a flu shot on following obtaining a flu shot she started feeling poorly Bronchitis  .  Steroids and albuterol did help symptoms  .  Symptoms started about October with shortness of breath and fatigue .  Followed up with her primary doctor .  December 22 she ended up in the emergency department where she was diagnosed with a viral URI, Covid negative  .  Has had persistent symptoms since then  .  She does have wheezing, shortness of breath, significant fatigue  .  She does have a history of reflux  .  Recently had a cardiac work-up done she was told that her heart was doing okay.  .  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 Anemia   . Arthritis   . ASHD (arteriosclerotic heart disease)   . Blood in stool    GI consult 2012, EGD and colonoscopy  . Eczema   . GERD (gastroesophageal reflux disease)   . H/O bone density study 07/09/2006   [facility]openia; [facility]  . Hemorrhoid    internal and external, general surgery consult 04/2011  . History of echocardiogram 02/17/2008   normal LV structure and function w/ EF >55%, trace MR, TR and PI  . History of nuclear stress test 02/2008   no significant ischemia, low risk scan, post  stress EF 68%  . Hyperlipidemia   . Hypertension   . Osteopenia    Social History   Socioeconomic History  . Marital status: Married    Spouse name: Not on file  . Number of children: Not on file  . Years of education: Not on file  . Highest education level: Not on file  Occupational History  . Occupation: [employer] benefits    Comment: Retired   Tobacco Use  . Smoking status: Never Smoker  . Smokeless tobacco: Never Used  Substance and Sexual Activity  . Alcohol use: No  . Drug use: No  . Sexual activity: Not Currently  Other Topics Concern  . Not on file  Social History Narrative   Widow.  Retired from [employer].   Three grand.     Social Determinants of Health   Financial Resource Strain:   . Difficulty of Paying Living Expenses:   Food Insecurity:   . Worried About Programme researcher, broadcasting/film/video in the Last Year:   . Barista in the Last Year:   Transportation Needs:   . Freight forwarder (Medical):   Marland Kitchen Lack of Transportation (Non-Medical):   Physical Activity:   . Days of Exercise per Week:   . Minutes of Exercise per Session:   Stress:   . Feeling of Stress :  Social Connections:   . Frequency of Communication with Friends and Family:   . Frequency of Social Gatherings with Friends and Family:   . Attends Religious Services:   . Active Member of Clubs or Organizations:   . Attends Banker Meetings:   Marland Kitchen Marital Status:   Intimate Partner Violence:   . Fear of Current or Ex-Partner:   . Emotionally Abused:   Marland Kitchen Physically Abused:   . Sexually Abused:    Family History  Problem Relation Age of Onset  . Heart disease Mother        Pacemaker  . Diabetes Mother   . Cancer Father        lung  . Diabetes Father   . Diabetes Sister   . Heart disease Sister   . Hypertension Sister   . Diabetes Brother   . Heart disease Brother        1 brother died MI age 49 yo  . Hypertension Brother   . Stroke Brother   . Cancer Other        maternal  side,non first degree breast cancer  . Hypertension Brother   . Diabetes Brother   . Heart disease Brother   . Heart disease Brother   . Hypertension Brother   . Diabetes Brother   . Hypertension Sister   . Hypertension Sister   . Diabetes Sister   . Diabetes Sister   . Hypertension Sister   . Hypertension Sister   . Diabetes Sister    Review of Systems  Constitutional: Positive for fatigue.  HENT: Positive for ear pain.   Respiratory: Positive for shortness of breath and wheezing.   Musculoskeletal: Positive for arthralgias.      Objective:   Physical Exam Constitutional:      Appearance: She is obese.  HENT:     Head: Normocephalic and atraumatic.     Nose: Nose normal. No congestion or rhinorrhea.     Mouth/Throat:     Mouth: Mucous membranes are moist.  Eyes:     General:        Right eye: No discharge.        Left eye: No discharge.     Pupils: Pupils are equal, round, and reactive to light.  Cardiovascular:     Rate and Rhythm: Normal rate and regular rhythm.     Pulses: Normal pulses.     Heart sounds: Normal heart sounds. No murmur. No friction rub.  Pulmonary:     Effort: Pulmonary effort is normal. No respiratory distress.     Breath sounds: Normal breath sounds. No stridor. No wheezing or rhonchi.  Musculoskeletal:        General: No swelling.     Cervical back: Normal range of motion and neck supple. No rigidity or tenderness.  Neurological:     Mental Status: She is alert.    Vitals:   07/20/19 1125  BP: 132/78  Pulse: 86  Temp: (!) 97.3 F (36.3 C)  SpO2: 97%   Myocardial perfusion scan 05/28/2019-no evidence of ischemia Chest x-ray 04/28/2019-no acute infiltrate-reviewed by myself  Thyroid function test within normal limits  Breathing study scheduled for April  She continues to use albuterol but not noticing significant improvement    Assessment & Plan:  .  Persistent shortness of breath and fatigue .  She did describe improvement in  symptoms with nasal steroids.  .  Denies a history of underlying lung disease   .  Her shortness  of breath remains concerning is minimal activity does feel short of breath now  .  We will obtain an echocardiogram to assess right-sided cardiac function and also left-sided heart function .  Obtain a CT PE angio  .  Attempt to obtain PFT sooner  .  Trial with Breo 100 sample  .  We will see her back in the office in about 2 to 3 weeks  .  Encouraged to call with any significant concerns    Patient did have a CT PE protocol-I did call her to let her know that it was negative for a PE, evidence of bronchitis

## 2019-07-20 NOTE — Patient Instructions (Signed)
Persistent shortness of breath with usual activities  Obtain echocardiogram Obtain CT PE angio  Attempt to obtain PFT sooner than when scheduled  Sample of Breo 100, to be used daily -Call us after you have tried this to see if it is helping and then will make a prescription out to you  We will see you in 2 to 3 weeks  Call with concerns

## 2019-07-22 ENCOUNTER — Other Ambulatory Visit: Payer: Self-pay

## 2019-07-22 ENCOUNTER — Ambulatory Visit (HOSPITAL_COMMUNITY): Payer: Medicare PPO | Attending: Internal Medicine

## 2019-07-22 DIAGNOSIS — R0602 Shortness of breath: Secondary | ICD-10-CM | POA: Insufficient documentation

## 2019-07-24 DIAGNOSIS — Z5181 Encounter for therapeutic drug level monitoring: Secondary | ICD-10-CM | POA: Diagnosis not present

## 2019-07-24 DIAGNOSIS — I10 Essential (primary) hypertension: Secondary | ICD-10-CM | POA: Diagnosis not present

## 2019-07-24 LAB — COMPREHENSIVE METABOLIC PANEL
ALT: 15 IU/L (ref 0–32)
AST: 18 IU/L (ref 0–40)
Albumin/Globulin Ratio: 2.2 (ref 1.2–2.2)
Albumin: 4.6 g/dL (ref 3.8–4.8)
Alkaline Phosphatase: 98 IU/L (ref 39–117)
BUN/Creatinine Ratio: 11 — ABNORMAL LOW (ref 12–28)
BUN: 12 mg/dL (ref 8–27)
Bilirubin Total: 0.5 mg/dL (ref 0.0–1.2)
CO2: 23 mmol/L (ref 20–29)
Calcium: 10.1 mg/dL (ref 8.7–10.3)
Chloride: 104 mmol/L (ref 96–106)
Creatinine, Ser: 1.14 mg/dL — ABNORMAL HIGH (ref 0.57–1.00)
GFR calc Af Amer: 57 mL/min/{1.73_m2} — ABNORMAL LOW (ref >59)
GFR calc non Af Amer: 50 mL/min/{1.73_m2} — ABNORMAL LOW (ref >59)
Globulin, Total: 2.1 g/dL (ref 1.5–4.5)
Glucose: 92 mg/dL (ref 65–99)
Potassium: 4.4 mmol/L (ref 3.5–5.2)
Sodium: 143 mmol/L (ref 134–144)
Total Protein: 6.7 g/dL (ref 6.0–8.5)

## 2019-07-24 LAB — LIPID PANEL
Chol/HDL Ratio: 3 ratio (ref 0.0–4.4)
Cholesterol, Total: 190 mg/dL (ref 100–199)
HDL: 63 mg/dL (ref >39)
LDL Chol Calc (NIH): 106 mg/dL — ABNORMAL HIGH (ref 0–99)
Triglycerides: 121 mg/dL (ref 0–149)
VLDL Cholesterol Cal: 21 mg/dL (ref 5–40)

## 2019-07-27 ENCOUNTER — Telehealth: Payer: Self-pay | Admitting: Pulmonary Disease

## 2019-07-27 MED ORDER — BREO ELLIPTA 100-25 MCG/INH IN AEPB: 1.0000 | INHALATION_SPRAY | Freq: Every day | RESPIRATORY_TRACT | 5 refills | Status: DC

## 2019-07-27 NOTE — Telephone Encounter (Signed)
rx for breo sent to pt's preferred pharmacy. Called and spoke with pt letting her know this had been done and she verbalized understanding. Nothing further needed.

## 2019-08-18 ENCOUNTER — Other Ambulatory Visit (HOSPITAL_COMMUNITY)
Admission: RE | Admit: 2019-08-18 | Discharge: 2019-08-18 | Disposition: A | Discharge status: Home / Self Care | Payer: Medicare PPO | Source: Ambulatory Visit | Attending: Pulmonary Disease | Admitting: Pulmonary Disease

## 2019-08-18 DIAGNOSIS — Z20822 Contact with and (suspected) exposure to covid-19: Secondary | ICD-10-CM | POA: Insufficient documentation

## 2019-08-18 DIAGNOSIS — Z01812 Encounter for preprocedural laboratory examination: Secondary | ICD-10-CM | POA: Insufficient documentation

## 2019-08-18 LAB — SARS CORONAVIRUS 2 (TAT 6-24 HRS): SARS Coronavirus 2: NEGATIVE

## 2019-08-20 ENCOUNTER — Telehealth: Payer: Self-pay | Admitting: *Deleted

## 2019-08-20 NOTE — Telephone Encounter (Signed)
Echocardiogram did reveal normal systolic function Abnormal relaxation-the heart muscles do not relax well enough for blood filling  -Management is to optimize any condition and situation that may put the heart at risk which will include optimizing diet, regular exercise, weight management, treatment of high blood pressure, treatment of diabetes.

## 2019-08-20 NOTE — Telephone Encounter (Signed)
Called patient with results. Nothing further needed. 

## 2019-08-20 NOTE — Telephone Encounter (Signed)
Patient requesting results of Echo done 07/22/19.

## 2019-08-21 ENCOUNTER — Other Ambulatory Visit: Payer: Self-pay

## 2019-08-21 ENCOUNTER — Ambulatory Visit (INDEPENDENT_AMBULATORY_CARE_PROVIDER_SITE_OTHER): Payer: Medicare PPO | Admitting: Pulmonary Disease

## 2019-08-21 DIAGNOSIS — R0600 Dyspnea, unspecified: Secondary | ICD-10-CM | POA: Diagnosis not present

## 2019-08-21 DIAGNOSIS — R5383 Other fatigue: Secondary | ICD-10-CM

## 2019-08-21 LAB — PULMONARY FUNCTION TEST
DL/VA % pred: 143 %
DL/VA: 5.93 ml/min/mmHg/L
DLCO cor % pred: 101 %
DLCO cor: 20.57 ml/min/mmHg
DLCO unc % pred: 101 %
DLCO unc: 20.57 ml/min/mmHg
FEF 25-75 Post: 0.72 L/sec
FEF 25-75 Pre: 2.02 L/sec
FEF2575-%Change-Post: -64 %
FEF2575-%Pred-Post: 38 %
FEF2575-%Pred-Pre: 109 %
FEV1-%Change-Post: -17 %
FEV1-%Pred-Post: 76 %
FEV1-%Pred-Pre: 92 %
FEV1-Post: 1.52 L
FEV1-Pre: 1.83 L
FEV1FVC-%Change-Post: -6 %
FEV1FVC-%Pred-Pre: 107 %
FEV6-%Change-Post: -14 %
FEV6-%Pred-Post: 76 %
FEV6-%Pred-Pre: 89 %
FEV6-Post: 1.88 L
FEV6-Pre: 2.19 L
FEV6FVC-%Change-Post: 0 %
FEV6FVC-%Pred-Post: 103 %
FEV6FVC-%Pred-Pre: 103 %
FVC-%Change-Post: -11 %
FVC-%Pred-Post: 76 %
FVC-%Pred-Pre: 86 %
FVC-Post: 1.94 L
FVC-Pre: 2.19 L
Post FEV1/FVC ratio: 78 %
Post FEV6/FVC ratio: 100 %
Pre FEV1/FVC ratio: 84 %
Pre FEV6/FVC Ratio: 100 %
RV % pred: 53 %
RV: 1.18 L
TLC % pred: 65 %
TLC: 3.38 L

## 2019-08-21 NOTE — Progress Notes (Signed)
Full PFT performed today. °

## 2019-09-07 ENCOUNTER — Telehealth: Payer: Self-pay | Admitting: Pulmonary Disease

## 2019-09-07 NOTE — Telephone Encounter (Signed)
Dr. Wynona Neat please advise on PFT results.

## 2019-09-09 NOTE — Telephone Encounter (Signed)
PFT shows no obstructive disease  Evidence of lung restriction-likely related to body weight  Follow-up in the office as scheduled

## 2019-09-09 NOTE — Telephone Encounter (Signed)
Spoke with pt, aware of recs.  Pt requesting appt to review CTA, echo, and PFT in detail to see how best she can help herself.  Scheduled appt for 5/14 with AO.  Nothing further needed at this time- will close encounter.

## 2019-09-10 ENCOUNTER — Other Ambulatory Visit: Payer: Self-pay | Admitting: Family Medicine

## 2019-09-10 DIAGNOSIS — I10 Essential (primary) hypertension: Secondary | ICD-10-CM

## 2019-09-14 DIAGNOSIS — H401221 Low-tension glaucoma, left eye, mild stage: Secondary | ICD-10-CM | POA: Diagnosis not present

## 2019-09-18 ENCOUNTER — Ambulatory Visit: Payer: Medicare PPO | Admitting: Pulmonary Disease

## 2019-09-18 ENCOUNTER — Encounter: Payer: Self-pay | Admitting: Pulmonary Disease

## 2019-09-18 ENCOUNTER — Other Ambulatory Visit: Payer: Self-pay

## 2019-09-18 VITALS — BP 130/90 | HR 72 | Temp 97.9°F | Ht 65.0 in | Wt 227.0 lb

## 2019-09-18 DIAGNOSIS — R0602 Shortness of breath: Secondary | ICD-10-CM | POA: Diagnosis not present

## 2019-09-18 NOTE — Progress Notes (Signed)
Subjective:    Patient ID: [name] [name], female    DOB: Jun 16, 1950, 69 y.o.   MRN: [id number]  Patient with complaints of fatigue, shortness of breath on exertion   Improvement in symptoms with use of Breo She is still short of breath with exertion  Echocardiogram reviewed-significant for diastolic dysfunction PFT significant for reduced total lung capacity-this is likely related to weight  Wheezing is better with Breo  Previously She had similar symptoms about a year ago following obtaining a flu shot .  She stated she required 3 rounds of antibiotics and steroids before symptoms finally settled down about a year ago   Recently about September she saw PCP for routine follow-up, she stated she was encouraged to get a flu shot on following obtaining a flu shot she started feeling poorly Bronchitis  .  Steroids and albuterol did help symptoms  .  Symptoms started about October with shortness of breath and fatigue .  Followed up with her primary doctor .  December 22 she ended up in the emergency department where she was diagnosed with a viral URI, Covid negative  .  Has had persistent symptoms since then  .  She does have wheezing, shortness of breath, significant fatigue  .  She does have a history of reflux  .  Recently had a cardiac work-up done she was told that her heart was doing okay.  .  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 Anemia   . Arthritis   . ASHD (arteriosclerotic heart disease)   . Blood in stool    GI consult 2012, EGD and colonoscopy  . Eczema   . GERD (gastroesophageal reflux disease)   . H/O bone density study 07/09/2006   [facility]openia; [facility]  . Hemorrhoid    internal and external, general surgery consult 04/2011  . History of echocardiogram 02/17/2008   normal LV structure and function w/ EF >55%, trace MR, TR and PI  . History of nuclear stress test 02/2008   no significant ischemia,  low risk scan, post stress EF 68%  . Hyperlipidemia   . Hypertension   . Osteopenia    Social History   Socioeconomic History  . Marital status: Married    Spouse name: Not on file  . Number of children: Not on file  . Years of education: Not on file  . Highest education level: Not on file  Occupational History  . Occupation: [employer] benefits    Comment: Retired   Tobacco Use  . Smoking status: Never Smoker  . Smokeless tobacco: Never Used  Substance and Sexual Activity  . Alcohol use: No  . Drug use: No  . Sexual activity: Not Currently  Other Topics Concern  . Not on file  Social History Narrative   Widow.  Retired from [employer].   Three grand.     Social Determinants of Health   Financial Resource Strain:   . Difficulty of Paying Living Expenses:   Food Insecurity:   . Worried About Programme researcher, broadcasting/film/video in the Last Year:   . Barista in the Last Year:   Transportation Needs:   . Freight forwarder (Medical):   Marland Kitchen Lack of Transportation (Non-Medical):   Physical Activity:   . Days of Exercise per Week:   . Minutes of Exercise per Session:   Stress:   . Feeling of Stress :  Social Connections:   . Frequency of Communication with Friends and Family:   . Frequency of Social Gatherings with Friends and Family:   . Attends Religious Services:   . Active Member of Clubs or Organizations:   . Attends Archivist Meetings:   Marland Kitchen Marital Status:   Intimate Partner Violence:   . Fear of Current or Ex-Partner:   . Emotionally Abused:   Marland Kitchen Physically Abused:   . Sexually Abused:    Family History  Problem Relation Age of Onset  . Heart disease Mother        Pacemaker  . Diabetes Mother   . Cancer Father        lung  . Diabetes Father   . Diabetes Sister   . Heart disease Sister   . Hypertension Sister   . Diabetes Brother   . Heart disease Brother        1 brother died MI age 25 yo  . Hypertension Brother   . Stroke Brother   . Cancer Other         maternal side,non first degree breast cancer  . Hypertension Brother   . Diabetes Brother   . Heart disease Brother   . Heart disease Brother   . Hypertension Brother   . Diabetes Brother   . Hypertension Sister   . Hypertension Sister   . Diabetes Sister   . Diabetes Sister   . Hypertension Sister   . Hypertension Sister   . Diabetes Sister    Review of Systems  Constitutional: Positive for fatigue.  Respiratory: Positive for shortness of breath and wheezing.   Musculoskeletal: Negative for arthralgias.      Objective:   Physical Exam Constitutional:      Appearance: She is obese.  HENT:     Head: Normocephalic and atraumatic.     Nose: Nose normal. No congestion or rhinorrhea.  Eyes:     General:        Right eye: No discharge.        Left eye: No discharge.  Cardiovascular:     Rate and Rhythm: Normal rate and regular rhythm.     Pulses: Normal pulses.     Heart sounds: Normal heart sounds. No murmur. No friction rub.  Pulmonary:     Effort: Pulmonary effort is normal. No respiratory distress.     Breath sounds: Normal breath sounds. No stridor. No wheezing or rhonchi.  Musculoskeletal:        General: No swelling.     Cervical back: Normal range of motion and neck supple. No rigidity or tenderness.  Neurological:     Mental Status: She is alert.    Vitals:   09/18/19 0947  BP: 130/90  Pulse: 72  Temp: 97.9 F (36.6 C)  SpO2: 97%   Myocardial perfusion scan 05/28/2019-no evidence of ischemia Chest x-ray 04/28/2019-no acute infiltrate-reviewed by myself  Thyroid function test within normal limits  PFT significant for reduced total lung capacity-restrictive picture, normal DLCO  Echocardiogram significant for diastolic dysfunction  Breathing study scheduled for April  She continues to use albuterol but not noticing significant improvement    Assessment & Plan:  .  Persistent shortness of breath and fatigue -Mild improvement in symptoms with use  of Breo  .  Denies a history of underlying lung disease  .  Continue Breo  .  Use albuterol as needed  .  Graded exercises as tolerated  .  Follow-up in about 3 months   .  Encouraged to call with any significant concerns

## 2019-09-18 NOTE — Patient Instructions (Signed)
I will follow-up with you in 3 months  Graded exercises as you can tolerate Call with significant concerns  Continue with Breo regularly Use albuterol as needed

## 2019-09-23 ENCOUNTER — Telehealth: Payer: Self-pay | Admitting: Pulmonary Disease
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
## 2019-10-16 DIAGNOSIS — R350 Frequency of micturition: Secondary | ICD-10-CM | POA: Diagnosis not present

## 2019-10-16 DIAGNOSIS — N3941 Urge incontinence: Secondary | ICD-10-CM | POA: Diagnosis not present

## 2019-11-12 ENCOUNTER — Encounter: Payer: Self-pay | Admitting: Cardiology

## 2019-11-12 DIAGNOSIS — Z7189 Other specified counseling: Secondary | ICD-10-CM | POA: Insufficient documentation

## 2019-11-12 DIAGNOSIS — I2510 Atherosclerotic heart disease of native coronary artery without angina pectoris: Secondary | ICD-10-CM | POA: Insufficient documentation

## 2019-11-12 NOTE — Progress Notes (Addendum)
Cardiology Office Note   Date:  11/13/2019   ID:  Hildreth, Orsak March 05, 1951, MRN [id number]  PCP:  Ronnald Nian, MD  Cardiologist:   No primary care provider on file.    Chief Complaint  Patient presents with   Shortness of Breath      History of Present Illness: [name] [name] is a 69 y.o. female who presented for evaluation of shortness of breath.  She had a normal echo in 2006.  She had a cardiac catheterization in 2006 with 40-50% proximal LAD stenosis and mild plaquing elsewhere I saw her in 2015 for evaluation of SOB.  She had a perfusion study in 2015 that was normal.  Echo in March demonstrated an EF of 60%.  She also had a perfusion study in January.  There was no ischemia on this.  She had pulmonary function testing in April with no significant findings that she has seen pulmonary.  She has been treated with Virgel Bouquet and has had some improvement in her breathing though I do not see that she has been diagnosed with any chronic lung disease.  She does not really do a lot of activities.  She has a stationary bicycle that she does not use.  She is apparently limited bilateral knee pain.  She is not describing PND or orthopnea.  She is not having any new palpitations, presyncope or syncope.  She has had a slow and steady weight gain over the last year and a half from excess calorie intake and decreased activity.    Past Medical History:  Diagnosis Date   Anemia    Arthritis    Blood in stool    GI consult 2012, EGD and colonoscopy   CAD (coronary artery disease)    Eczema    GERD (gastroesophageal reflux disease)    H/O bone density study 07/09/2006   [facility]; [facility]   Hemorrhoid    internal and external, general surgery consult 04/2011   Hyperlipidemia    Hypertension    Osteopenia     Past Surgical History:  Procedure Laterality Date   ABDOMINAL HYSTERECTOMY     COLONOSCOPY  2012, 2008   Dr. Loreta Ave   ESOPHAGOGASTRODUODENOSCOPY  2012,  2008   Dr. Loreta Ave     Current Outpatient Medications  Medication Sig Dispense Refill   albuterol (VENTOLIN HFA) 108 (90 Base) MCG/ACT inhaler Inhale 2 puffs into the lungs every 6 (six) hours as needed for wheezing or shortness of breath. 18 g 11   Ascorbic Acid (VITAMIN C) 100 MG CHEW Vitamin C     aspirin 81 MG tablet Take 81 mg by mouth daily.     atorvastatin (LIPITOR) 80 MG tablet Take 1 tablet (80 mg total) by mouth daily. 90 tablet 3   Bimatoprost (LUMIGAN OP) Apply 1 drop to eye once. Only in left eye     bisoprolol-hydrochlorothiazide (ZIAC) 5-6.25 MG tablet TAKE 2 TABLETS BY MOUTH DAILY 180 tablet 1   dexlansoprazole (DEXILANT) 60 MG capsule Take 1 capsule (60 mg total) by mouth daily. 90 capsule 3   felodipine (PLENDIL) 10 MG 24 hr tablet TAKE 1 TABLET(10 MG) BY MOUTH DAILY 90 tablet 3   FIBER PO Take 500 mg by mouth daily.     fluticasone furoate-vilanterol (BREO ELLIPTA) 100-25 MCG/INH AEPB Inhale 1 puff into the lungs daily. 60 each 5   IRON PO Take 1,000 mg by mouth.     Lifitegrast (XIIDRA OP) Apply 1 drop  to eye 2 (two) times daily at 10 AM and 5 PM.     linaclotide (LINZESS) 145 MCG CAPS capsule Take 145 mcg by mouth daily before breakfast.     Multiple Vitamin (MULTIVITAMIN) capsule Take 1 capsule by mouth daily.       Omega-3 Fatty Acids (FISH OIL PO) Take by mouth daily. 4 capsules daily     PREVIDENT 5000 ENAMEL PROTECT 1.1-5 % PSTE daily.  0   solifenacin (VESICARE) 5 MG tablet Take 5 mg by mouth daily.     vitamin E 400 UNIT capsule Take 400 Units by mouth daily.     No current facility-administered medications for this visit.    Allergies:   Crestor [rosuvastatin] and Penicillins    ROS:  Please see the history of present illness.   Otherwise, review of systems are positive for none.   All other systems are reviewed and negative.    PHYSICAL EXAM: VS:  BP 130/78    Pulse (!) 54    Temp (!) 96.8 F (36 C)    Ht 5\' 5"  (1.651 m)    Wt 225 lb  (102.1 kg)    SpO2 96%    BMI 37.44 kg/m  , BMI Body mass index is 37.44 kg/m. GENERAL:  Well appearing NECK:  No jugular venous distention, waveform within normal limits, carotid upstroke brisk and symmetric, no bruits, no thyromegaly LUNGS:  Clear to auscultation bilaterally CHEST:  Unremarkable HEART:  PMI not displaced or sustained,S1 and S2 within normal limits, no S3, no S4, no clicks, no rubs, no murmurs ABD:  Flat, positive bowel sounds normal in frequency in pitch, no bruits, no rebound, no guarding, no midline pulsatile mass, no hepatomegaly, no splenomegaly EXT:  2 plus pulses throughout, no edema, no cyanosis no clubbing   EKG:  EKG is  ordered today. The ekg ordered today demonstrates sinus rhythm, rate 54, axis within normal limits, intervals within normal limits, no acute ST-T wave changes.   Recent Labs: 04/28/2019: TSH 2.012 06/18/2019: Hemoglobin 12.9; Platelets 217.0 07/24/2019: ALT 15; BUN 12; Creatinine, Ser 1.14; Potassium 4.4; Sodium 143    Lipid Panel    Component Value Date/Time   CHOL 190 07/24/2019 0834   TRIG 121 07/24/2019 0834   HDL 63 07/24/2019 0834   CHOLHDL 3.0 07/24/2019 0834   CHOLHDL 2.8 11/09/2016 0850   VLDL 29 11/09/2016 0850   LDLCALC 106 (H) 07/24/2019 0834      Wt Readings from Last 3 Encounters:  11/13/19 225 lb (102.1 kg)  09/18/19 227 lb (103 kg)  07/20/19 224 lb (101.6 kg)      Other studies Reviewed: Additional studies/ records that were reviewed today include: Labs, PFTs Review of the above records demonstrates:  Please see elsewhere in the note.     ASSESSMENT AND PLAN:  DYSPNEA:   Perfusion study was negative and echo demonstrated a normal EF.  She had no high risk findings on her pulmonary function testing.  I think this is probably weight and deconditioning we had a long discussion about this.  I gave her goals of weight loss and suggested increased activity to include the exercise bike she has in the bedroom.    CAD:   He has had non obstructive CAD and a negative perfusion study.  No further work-up.  She needs primary risk reduction.  WEIGHT:   As above.  HTN: The blood pressure is at target.  No change in therapy.   DYSLIPIDEMIA:  I increased the Lipitor.  I will check another lipid profile.  Goal being LDL less than 70.  I will get a lipid profile and liver enzymes in 10 weeks.  COVID EDUCATION:   She has not had the vaccine and we again talked about this.  She was educated.     Current medicines are reviewed at length with the patient today.  The patient does not have concerns regarding medicines.  The following changes have been made:  None  Labs/ tests ordered today include: Lipids  Orders Placed This Encounter  Procedures   Lipid panel   EKG 12-Lead     Disposition:   FU with APP in one year.    Signed, Rollene Rotunda, MD  11/13/2019 11:11 AM    [facility]

## 2019-11-13 ENCOUNTER — Ambulatory Visit: Payer: Medicare PPO | Admitting: Cardiology

## 2019-11-13 ENCOUNTER — Other Ambulatory Visit: Payer: Self-pay

## 2019-11-13 ENCOUNTER — Encounter: Payer: Self-pay | Admitting: Cardiology

## 2019-11-13 VITALS — BP 130/78 | HR 54 | Temp 96.8°F | Ht 65.0 in | Wt 225.0 lb

## 2019-11-13 DIAGNOSIS — R0602 Shortness of breath: Secondary | ICD-10-CM

## 2019-11-13 DIAGNOSIS — I10 Essential (primary) hypertension: Secondary | ICD-10-CM | POA: Diagnosis not present

## 2019-11-13 DIAGNOSIS — E785 Hyperlipidemia, unspecified: Secondary | ICD-10-CM | POA: Diagnosis not present

## 2019-11-13 DIAGNOSIS — I2510 Atherosclerotic heart disease of native coronary artery without angina pectoris: Secondary | ICD-10-CM

## 2019-11-13 DIAGNOSIS — Z7189 Other specified counseling: Secondary | ICD-10-CM | POA: Diagnosis not present

## 2019-11-13 NOTE — Patient Instructions (Signed)
Medication Instructions:  No changes *If you need a refill on your cardiac medications before your next appointment, please call your pharmacy*   Lab Work: Your provider would like for you to have the following labs today: Lipid  If you have labs (blood work) drawn today and your tests are completely normal, you will receive your results only by: Marland Kitchen MyChart Message (if you have MyChart) OR . A paper copy in the mail If you have any lab test that is abnormal or we need to change your treatment, we will call you to review the results.   Testing/Procedures: None ordered   Follow-Up: At [facility], you and your health needs are our priority.  As part of our continuing mission to provide you with exceptional heart care, we have created designated Provider Care Teams.  These Care Teams include your primary Cardiologist (physician) and Advanced Practice Providers (APPs -  Physician Assistants and Nurse Practitioners) who all work together to provide you with the care you need, when you need it.  We recommend signing up for the patient portal called "MyChart".  Sign up information is provided on this After Visit Summary.  MyChart is used to connect with patients for Virtual Visits (Telemedicine).  Patients are able to view lab/test results, encounter notes, upcoming appointments, etc.  Non-urgent messages can be sent to your provider as well.   To learn more about what you can do with MyChart, go to [email or web address].    Your next appointment:   12 month(s)  The format for your next appointment:   In Person  Provider:    Advanced Practice Providers on your designated Care Team:    Theodore Demark, PA-C  Joni Reining, DNP, ANP  Cadence Fransico Michael, PA-C

## 2019-11-14 LAB — LIPID PANEL
Chol/HDL Ratio: 3.5 ratio (ref 0.0–4.4)
Cholesterol, Total: 179 mg/dL (ref 100–199)
HDL: 51 mg/dL (ref >39)
LDL Chol Calc (NIH): 100 mg/dL — ABNORMAL HIGH (ref 0–99)
Triglycerides: 158 mg/dL — ABNORMAL HIGH (ref 0–149)
VLDL Cholesterol Cal: 28 mg/dL (ref 5–40)

## 2019-11-24 ENCOUNTER — Telehealth: Payer: Self-pay

## 2019-11-24 NOTE — Telephone Encounter (Signed)
Pt was advise that she can come by and pick up paperwork. KH

## 2019-12-15 DIAGNOSIS — H401221 Low-tension glaucoma, left eye, mild stage: Secondary | ICD-10-CM | POA: Diagnosis not present

## 2019-12-24 ENCOUNTER — Encounter: Payer: Self-pay | Admitting: Pulmonary Disease

## 2019-12-24 ENCOUNTER — Other Ambulatory Visit: Payer: Self-pay

## 2019-12-24 ENCOUNTER — Ambulatory Visit: Payer: Medicare PPO | Admitting: Pulmonary Disease

## 2019-12-24 VITALS — BP 116/78 | HR 63 | Temp 97.7°F | Ht 65.0 in | Wt 226.2 lb

## 2019-12-24 DIAGNOSIS — R0602 Shortness of breath: Secondary | ICD-10-CM | POA: Diagnosis not present

## 2019-12-24 NOTE — Patient Instructions (Signed)
Continue graded exercises as tolerated  Continue using Breo However, if you feel you do not need Breo anymore you can stop it completely and see how your symptoms stabilize over the next couple of weeks  Always have a rescue inhaler available, this is what you should use if you have unusual shortness of breath  We will see you in 6 months  Call with significant concerns

## 2019-12-24 NOTE — Progress Notes (Signed)
Subjective:    Patient ID: [name] [name], female    DOB: [birth year], 69 y.o.   MRN: [id number]  Patient with complaints of fatigue, shortness of breath on exertion  Breathing is better  Has questions about whether she needs to continue using inhalers at present Denies any chest pains or chest discomfort Able to tolerate more exertion  Improvement in symptoms with use of Breo She is still short of breath with exertion  Echocardiogram reviewed-significant for diastolic dysfunction PFT significant for reduced total lung capacity-this is likely related to weight  Wheezing is better with Breo  Previously She had similar symptoms about a year ago following obtaining a flu shot .  She stated she required 3 rounds of antibiotics and steroids before symptoms finally settled down about a year ago   Recently about September she saw PCP for routine follow-up, she stated she was encouraged to get a flu shot on following obtaining a flu shot she started feeling poorly Bronchitis  .  Steroids and albuterol did help symptoms  .  Symptoms started about October with shortness of breath and fatigue .  Followed up with her primary doctor .  December 22 she ended up in the emergency department where she was diagnosed with a viral URI, Covid negative  .  Has had persistent symptoms since then  .  She does have wheezing, shortness of breath, significant fatigue  .  She does have a history of reflux  .  Recently had a cardiac work-up done she was told that her heart was doing okay.  .  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 Anemia   . Arthritis   . Blood in stool    GI consult 2012, EGD and colonoscopy  . CAD (coronary artery disease)   . Eczema   . GERD (gastroesophageal reflux disease)   . H/O bone density study 07/09/2006   osteopenia; [facility]  . Hemorrhoid    internal and external, general surgery consult 04/2011  .  Hyperlipidemia   . Hypertension   . Osteopenia    Social History   Socioeconomic History  . Marital status: Widowed    Spouse name: Not on file  . Number of children: Not on file  . Years of education: Not on file  . Highest education level: Not on file  Occupational History  . Occupation: [employer]    Comment: Retired   Tobacco Use  . Smoking status: Never Smoker  . Smokeless tobacco: Never Used  Substance and Sexual Activity  . Alcohol use: No  . Drug use: No  . Sexual activity: Not Currently  Other Topics Concern  . Not on file  Social History Narrative   Widow.  Retired from [employer].   Three grand.     Social Determinants of Health   Financial Resource Strain:   . Difficulty of Paying Living Expenses: Not on file  Food Insecurity:   . Worried About Programme researcher, broadcasting/film/video in the Last Year: Not on file  . Ran Out of Food in the Last Year: Not on file  Transportation Needs:   . Lack of Transportation (Medical): Not on file  . Lack of Transportation (Non-Medical): Not on file  Physical Activity:   . Days of Exercise per Week: Not on file  . Minutes of Exercise per Session: Not on file  Stress:   . Feeling of Stress : Not  on file  Social Connections:   . Frequency of Communication with Friends and Family: Not on file  . Frequency of Social Gatherings with Friends and Family: Not on file  . Attends Religious Services: Not on file  . Active Member of Clubs or Organizations: Not on file  . Attends Banker Meetings: Not on file  . Marital Status: Not on file  Intimate Partner Violence:   . Fear of Current or Ex-Partner: Not on file  . Emotionally Abused: Not on file  . Physically Abused: Not on file  . Sexually Abused: Not on file   Family History  Problem Relation Age of Onset  . Heart disease Mother        Pacemaker  . Diabetes Mother   . Cancer Father        lung  . Diabetes Father   . Diabetes Sister   . Heart disease Sister   . Hypertension  Sister   . Diabetes Brother   . Heart disease Brother        1 brother died MI age 8 yo  . Hypertension Brother   . Stroke Brother   . Cancer Other        maternal side,non first degree breast cancer  . Hypertension Brother   . Diabetes Brother   . Heart disease Brother   . Heart disease Brother   . Hypertension Brother   . Diabetes Brother   . Hypertension Sister   . Hypertension Sister   . Diabetes Sister   . Diabetes Sister   . Hypertension Sister   . Hypertension Sister   . Diabetes Sister    Review of Systems  Constitutional: Positive for fatigue.  Respiratory: Positive for shortness of breath and wheezing.   Cardiovascular: Negative.   Gastrointestinal: Negative.   Genitourinary: Negative.   Musculoskeletal: Negative for arthralgias.      Objective:   Physical Exam Constitutional:      Appearance: She is obese.  HENT:     Head: Normocephalic and atraumatic.  Cardiovascular:     Rate and Rhythm: Normal rate and regular rhythm.     Pulses: Normal pulses.     Heart sounds: Normal heart sounds. No murmur heard.  No friction rub.  Pulmonary:     Effort: Pulmonary effort is normal. No respiratory distress.     Breath sounds: Normal breath sounds. No stridor. No wheezing or rhonchi.  Musculoskeletal:        General: No swelling.     Cervical back: No rigidity or tenderness.  Neurological:     Mental Status: She is alert.    Vitals:   12/24/19 1208  BP: 116/78  Pulse: 63  Temp: 97.7 F (36.5 C)  SpO2: 96%   Myocardial perfusion scan 05/28/2019-no evidence of ischemia Chest x-ray 04/28/2019-no acute infiltrate-reviewed by myself  Thyroid function test within normal limits  PFT significant for reduced total lung capacity-restrictive picture, normal DLCO  Echocardiogram significant for diastolic dysfunction  Breathing study scheduled for April  She continues to use albuterol but not noticing significant improvement    Assessment & Plan:  .   Persistent shortness of breath and fatigue -Mild improvement in symptoms with use of Breo  .  Denies a history of underlying lung disease  .  Continue Breo -We talked about possibility of discontinuing Breo as she is feeling much better -If she decides to discontinue Breo I told her to hold off about 1 to 2 weeks to see  how symptoms stabilize following being off of it -Encouraged to always have albuterol around in case she gets in trouble  .  Graded exercises as tolerated  .  Follow-up in about 3 months   .  Encouraged to call with any significant concerns .  Continue with graded exercises .  Had a lengthy discussion about the need to get vaccinated for Covid-I did encourage her although she has concerns because she developed a lot of problems that she associates with getting the flu shot-this was discussed extensively

## 2020-01-19 DIAGNOSIS — N950 Postmenopausal bleeding: Secondary | ICD-10-CM | POA: Diagnosis not present

## 2020-01-19 DIAGNOSIS — R8290 Unspecified abnormal findings in urine: Secondary | ICD-10-CM | POA: Diagnosis not present

## 2020-01-29 ENCOUNTER — Ambulatory Visit: Payer: Medicare Other | Admitting: Family Medicine

## 2020-01-29 ENCOUNTER — Telehealth: Payer: Self-pay | Admitting: Family Medicine

## 2020-01-29 NOTE — Telephone Encounter (Signed)
Pt purchased Easy rest adjustable bed online Because she states she has trouble getting out of bed because of her knees  She has found out that her insurance, [employer] may pay for some of this cost  She brought a form by previously and apparently we gave her a dx code for the bed but she also needs Cpt code. [employer] called today with patient also states that she would need An order and dx from physician

## 2020-01-30 NOTE — Telephone Encounter (Signed)
Not sure about this.She would need an eval by PT/OT  to see if she qualifies for this

## 2020-02-02 NOTE — Telephone Encounter (Signed)
Spoke to pt and advised at her appt 02/10/20 we will go over the ortho pedic bed need and steps that have to be taken [facility]

## 2020-02-10 ENCOUNTER — Encounter: Payer: Self-pay | Admitting: Family Medicine

## 2020-02-10 ENCOUNTER — Ambulatory Visit: Payer: Medicare PPO | Admitting: Family Medicine

## 2020-02-10 ENCOUNTER — Other Ambulatory Visit: Payer: Self-pay

## 2020-02-10 VITALS — BP 128/78 | HR 57 | Temp 97.8°F | Ht 64.0 in | Wt 224.0 lb

## 2020-02-10 DIAGNOSIS — Z23 Encounter for immunization: Secondary | ICD-10-CM | POA: Diagnosis not present

## 2020-02-10 DIAGNOSIS — E669 Obesity, unspecified: Secondary | ICD-10-CM | POA: Insufficient documentation

## 2020-02-10 DIAGNOSIS — E785 Hyperlipidemia, unspecified: Secondary | ICD-10-CM | POA: Diagnosis not present

## 2020-02-10 DIAGNOSIS — M1990 Unspecified osteoarthritis, unspecified site: Secondary | ICD-10-CM | POA: Diagnosis not present

## 2020-02-10 DIAGNOSIS — N3281 Overactive bladder: Secondary | ICD-10-CM | POA: Diagnosis not present

## 2020-02-10 DIAGNOSIS — K219 Gastro-esophageal reflux disease without esophagitis: Secondary | ICD-10-CM | POA: Diagnosis not present

## 2020-02-10 DIAGNOSIS — M8580 Other specified disorders of bone density and structure, unspecified site: Secondary | ICD-10-CM

## 2020-02-10 DIAGNOSIS — I10 Essential (primary) hypertension: Secondary | ICD-10-CM

## 2020-02-10 DIAGNOSIS — N1831 Chronic kidney disease, stage 3a: Secondary | ICD-10-CM

## 2020-02-10 DIAGNOSIS — K649 Unspecified hemorrhoids: Secondary | ICD-10-CM | POA: Diagnosis not present

## 2020-02-10 DIAGNOSIS — Z0000 Encounter for general adult medical examination without abnormal findings: Secondary | ICD-10-CM

## 2020-02-10 DIAGNOSIS — J309 Allergic rhinitis, unspecified: Secondary | ICD-10-CM

## 2020-02-10 DIAGNOSIS — K5900 Constipation, unspecified: Secondary | ICD-10-CM | POA: Insufficient documentation

## 2020-02-10 DIAGNOSIS — I2510 Atherosclerotic heart disease of native coronary artery without angina pectoris: Secondary | ICD-10-CM

## 2020-02-10 DIAGNOSIS — I700 Atherosclerosis of aorta: Secondary | ICD-10-CM | POA: Insufficient documentation

## 2020-02-10 NOTE — Patient Instructions (Signed)
°  Ms. Carreras , Thank you for taking time to come for your Medicare Wellness Visit. I appreciate your ongoing commitment to your health goals. Please review the following plan we discussed and let me know if I can assist you in the future.   These are the goals we discussed: You can take 2 Tylenol 4 times per day for your aches and pains.  Keep doing all your physical therapy .  Continue to work on cutting back on carbohydrates This is a list of the screening recommended for you and due dates:  Health Maintenance  Topic Date Due   Flu Shot  12/06/2019   Mammogram  12/23/2020   Colon Cancer Screening  03/08/2021   Tetanus Vaccine  10/15/2021   DEXA scan (bone density measurement)  Completed   COVID-19 Vaccine  Completed    Hepatitis C: One time screening is recommended by Center for Disease Control  (CDC) for  adults born from [birth year] through 1964.   Completed   Pneumonia vaccines  Discontinued

## 2020-02-10 NOTE — Progress Notes (Signed)
[name] [name] is a 69 y.o. female who presents for annual wellness visit,CPE and follow-up on chronic medical conditions.  She has hypertension and is taking Ziac and amlodipine for that.  She also has hyperlipidemia and is having no difficulty with atorvastatin.  Does have a history of osteopenia and did have a previous DEXA. We scheduled another year or so.  She does have a history of constipation with difficulty with hemorrhoids and is now taking Linzess with good results.  Her reflux seems to be under good control.  She does have history of adenomatous colonic polyps and is scheduled for routine care for that.  She does have difficulty with arthritis and has had injections by Dr. August Saucer for this.  She is now riding a bike 45 minutes at a time and seems to be helping.  She also has a history of obesity.  Review of her medical record indicates evidence of CKD.  She is also taking Vesicare for her OAB and seems to have this under fairly good control although occasionally she does use depends.  She does see cardiology for her underlying coronary artery disease.  Also history of atherosclerosis.  As mentioned above she continues on atorvastatin.  Immunizations and Health Maintenance Immunization History  Administered Date(s) Administered  . Fluad Quad(high Dose 65+) 01/28/2019  . Influenza Split 02/02/2015, 12/17/2015  . Influenza, High Dose Seasonal PF 01/08/2017, 02/04/2018  . Pneumococcal Conjugate-13 11/09/2016  . Pneumococcal Polysaccharide-23 03/11/2008  . Td 12/31/1995  . Tdap 10/16/2011  . Zoster 10/17/2011  . Zoster Recombinat (Shingrix) 11/09/2016, 01/08/2017   Health Maintenance Due  Topic Date Due  . COVID-19 Vaccine (1) Never done  . INFLUENZA VACCINE  12/06/2019    Last Pap smear: aged out  Last mammogram: 12/24/18 Last colonoscopy: 03/09/11 Last DEXA: 05/05/19 Dentist: Q three months  Ophtho: Q year Exercise: bike riding started   Other doctors caring for patient include: Dr.  Wynona Neat pulmonology, Dr. Kirtland Bouchard cardio August Saucer ortho  Advanced directives: Does Patient Have a Medical Advance Directive?: No Would patient like information on creating a medical advance directive?: Yes (ED - Information included in AVS)  Depression screen:  See questionnaire below.  Depression screen [facility] 2/9 02/10/2020 01/28/2019 12/13/2017 11/09/2016 10/18/2014  Decreased Interest 0 0 0 0 0  Down, Depressed, Hopeless 0 1 0 0 0  PHQ - 2 Score 0 1 0 0 0    Fall Risk Screen: see questionnaire below. Fall Risk  02/10/2020 01/28/2019 12/13/2017 11/09/2016 10/18/2014  Falls in the past year? 1 0 No No No  Number falls in past yr: 1 - - - -  Injury with Fall? 0 - - - -  Risk for fall due to : Impaired balance/gait - - - -    ADL screen:  See questionnaire below Functional Status Survey: Is the patient deaf or have difficulty hearing?: No Does the patient have difficulty seeing, even when wearing glasses/contacts?: No Does the patient have difficulty concentrating, remembering, or making decisions?: Yes (remebering) Does the patient have difficulty walking or climbing stairs?: Yes Does the patient have difficulty dressing or bathing?: No Does the patient have difficulty doing errands alone such as visiting a doctor's office or shopping?: No   Review of Systems Constitutional: -, -unexpected weight change, -anorexia, -fatigue Allergy: -sneezing, -itching, -congestion Dermatology: denies changing moles, rash, lumps ENT: -runny nose, -ear pain, -sore throat,  Cardiology:  -chest pain, -palpitations, -orthopnea, Respiratory: -cough, -shortness of breath, -dyspnea on exertion, -wheezing,  Gastroenterology: -abdominal  pain, -nausea, -vomiting, -diarrhea, -constipation, -dysphagia Hematology: -bleeding or bruising problems Musculoskeletal: -arthralgias, -myalgias, -joint swelling, -back pain, - Ophthalmology: -vision changes,  Urology: -dysuria, -difficulty urinating,  -urinary frequency, -urgency,  incontinence Neurology: -, -numbness, , -memory loss, -falls, -dizziness    PHYSICAL EXAM:   General Appearance: Alert, cooperative, no distress, appears stated age Head: Normocephalic, without obvious abnormality, atraumatic Eyes: PERRL, conjunctiva/corneas clear, EOM's intact, Ears: Normal TM's and external ear canals Nose: Nares normal, mucosa normal, no drainage or sinus tenderness Throat: Lips, mucosa, and tongue normal; teeth and gums normal Neck: Supple, no lymphadenopathy;  thyroid:  no enlargement/tenderness/nodules; no carotid bruit or JVD Lungs: Clear to auscultation bilaterally without wheezes, rales or ronchi; respirations unlabored Heart: Regular rate and rhythm, S1 and S2 normal, no murmur, rubor gallop Abdomen: Soft, non-tender, nondistended, normoactive bowel sounds,  no masses, no hepatosplenomegaly Extremities: No clubbing, cyanosis or edema Pulses: 2+ and symmetric all extremities Skin:  Skin color, texture, turgor normal, no rashes or lesions Lymph nodes: Cervical, supraclavicular, and axillary nodes normal Neurologic:  CNII-XII intact, normal strength, sensation and gait; reflexes 2+ and symmetric throughout Psych: Normal mood, affect, hygiene and grooming.  ASSESSMENT/PLAN: Routine general medical examination at a health care facility  OAB (overactive bladder)  Arthritis  Gastroesophageal reflux disease without esophagitis  Hemorrhoids, unspecified hemorrhoid type  Essential hypertension, benign  Coronary artery disease involving native coronary artery of native heart without angina pectoris  Allergic rhinitis, unspecified seasonality, unspecified trigger  Hyperlipidemia, unspecified hyperlipidemia type  Osteopenia, unspecified location  Atherosclerosis of aorta (HCC)  Need for influenza vaccination - Plan: Flu Vaccine QUAD High Dose(Fluad), CANCELED: Flu vaccine HIGH DOSE PF (Fluzone High dose)  Constipation, unspecified constipation  type  Obesity (BMI 30-39.9)  Stage 3a chronic kidney disease (HCC)  Encouraged her to continue with her bicycle riding and also discussed cutting back on carbohydrates to help with her weight.  She will continue on her present medication regimen.  DEXA will be ordered in another year.  Follow-up with other specialists as needed.   Discussed ; at least 30 minutes of aerobic activity at least 5 days/week and weight-bearing exercise 2x/week; ; healthy diet, including goals of calcium and vitamin D intake Immunization recommendations discussed.  Colonoscopy recommendations reviewed   Medicare Attestation I have personally reviewed: The patient's medical and social history Their use of alcohol, tobacco or illicit drugs Their current medications and supplements The patient's functional ability including ADLs,fall risks, home safety risks, cognitive, and hearing and visual impairment Diet and physical activities Evidence for depression or mood disorders  The patient's weight, height, and BMI have been recorded in the chart.  I have made referrals, counseling, and provided education to the patient based on review of the above and I have provided the patient with a written personalized care plan for preventive services.     Sharlot Gowda, MD   02/10/2020

## 2020-02-16 ENCOUNTER — Telehealth: Payer: Self-pay | Admitting: Cardiology

## 2020-02-16 NOTE — Telephone Encounter (Signed)
Informed patient that we checked her lipid panel in July, not her thyroid. Informed patient that her thyroid should be checked by her PCP. Informed her that her thyroid was check in February and it was normal. Encouraged patient to contact her PCP about any further thyroid questions. Patient verbalized understanding.

## 2020-02-16 NOTE — Telephone Encounter (Signed)
Patient is calling to see if her lab work from July showed any abnormalities with her thyroid. Please advise.

## 2020-02-23 ENCOUNTER — Telehealth: Payer: Self-pay

## 2020-02-23 NOTE — Telephone Encounter (Signed)
Have her come in 

## 2020-02-23 NOTE — Telephone Encounter (Signed)
Pt. Called stating she was here recently and saw you for her CPE. She went to the dentist this week and they told her, her neck looked swollen and thyroid area felt full and to contact us about it. She contacted her Cards doctor to see if they checked her thyroid levels last visit and they told her they were normal last time and to contact her PCP to f/u about her thyroid issue. She wanted to know if you checked it at her apt. Here a few weeks ago or does she need to come back in so you can check it out.

## 2020-02-23 NOTE — Telephone Encounter (Signed)
Pt. Scheduled tomorrow.

## 2020-02-24 ENCOUNTER — Encounter: Payer: Self-pay | Admitting: Family Medicine

## 2020-02-24 ENCOUNTER — Other Ambulatory Visit: Payer: Self-pay

## 2020-02-24 ENCOUNTER — Ambulatory Visit: Payer: Medicare PPO | Admitting: Family Medicine

## 2020-02-24 VITALS — BP 126/82 | HR 55 | Temp 97.9°F | Wt 225.8 lb

## 2020-02-24 DIAGNOSIS — R5383 Other fatigue: Secondary | ICD-10-CM

## 2020-02-24 NOTE — Progress Notes (Signed)
   Subjective:    Patient ID: [name] [name], female    DOB: 06-25-1950, 69 y.o.   MRN: [id number]  HPI She is here for consult after seeing her dentist recently.  Apparently the dentist felt possible right-sided thyroid enlargement.  She does complain of fatigue and has seen cardiology recently.  Apparently that evaluation was negative.  She has started a general exercise program and states that this does help with her aches and pains and to a lesser extent the fatigue.   Review of Systems     Objective:   Physical Exam Alert and in no distress.  Exam of the neck shows no thyromegaly or adenopathy.  A slight fullness is felt in the right Nichols joint area but it is not well distinct, soft and easily movable.       Assessment & Plan:  Fatigue, unspecified type I stated that I did not feel anything to be concerned about, specifically thyroid or lymph node.  Encouraged her to continue with her physical activity.

## 2020-02-29 DIAGNOSIS — N958 Other specified menopausal and perimenopausal disorders: Secondary | ICD-10-CM | POA: Diagnosis not present

## 2020-02-29 DIAGNOSIS — M816 Localized osteoporosis [Lequesne]: Secondary | ICD-10-CM | POA: Diagnosis not present

## 2020-02-29 DIAGNOSIS — Z1231 Encounter for screening mammogram for malignant neoplasm of breast: Secondary | ICD-10-CM | POA: Diagnosis not present

## 2020-02-29 DIAGNOSIS — Z01419 Encounter for gynecological examination (general) (routine) without abnormal findings: Secondary | ICD-10-CM | POA: Diagnosis not present

## 2020-02-29 DIAGNOSIS — Z6837 Body mass index (BMI) 37.0-37.9, adult: Secondary | ICD-10-CM | POA: Diagnosis not present

## 2020-03-09 ENCOUNTER — Other Ambulatory Visit: Payer: Self-pay | Admitting: Family Medicine

## 2020-03-09 DIAGNOSIS — I10 Essential (primary) hypertension: Secondary | ICD-10-CM

## 2020-03-21 DIAGNOSIS — H16223 Keratoconjunctivitis sicca, not specified as Sjogren's, bilateral: Secondary | ICD-10-CM | POA: Diagnosis not present

## 2020-03-21 DIAGNOSIS — H401221 Low-tension glaucoma, left eye, mild stage: Secondary | ICD-10-CM | POA: Diagnosis not present

## 2020-03-21 DIAGNOSIS — H25013 Cortical age-related cataract, bilateral: Secondary | ICD-10-CM | POA: Diagnosis not present

## 2020-03-21 DIAGNOSIS — H43812 Vitreous degeneration, left eye: Secondary | ICD-10-CM | POA: Diagnosis not present

## 2020-03-29 ENCOUNTER — Other Ambulatory Visit (INDEPENDENT_AMBULATORY_CARE_PROVIDER_SITE_OTHER): Payer: Medicare PPO

## 2020-03-29 ENCOUNTER — Other Ambulatory Visit: Payer: Self-pay

## 2020-03-29 ENCOUNTER — Encounter: Payer: Self-pay | Admitting: Medical

## 2020-03-29 ENCOUNTER — Telehealth (INDEPENDENT_AMBULATORY_CARE_PROVIDER_SITE_OTHER): Payer: Medicare PPO | Admitting: Medical

## 2020-03-29 VITALS — Ht 65.0 in | Wt 210.0 lb

## 2020-03-29 DIAGNOSIS — R059 Cough, unspecified: Secondary | ICD-10-CM

## 2020-03-29 DIAGNOSIS — J029 Acute pharyngitis, unspecified: Secondary | ICD-10-CM

## 2020-03-29 DIAGNOSIS — J988 Other specified respiratory disorders: Secondary | ICD-10-CM

## 2020-03-29 DIAGNOSIS — R0981 Nasal congestion: Secondary | ICD-10-CM

## 2020-03-29 LAB — POC COVID19 BINAXNOW: SARS Coronavirus 2 Ag: NEGATIVE

## 2020-03-29 MED ORDER — AZITHROMYCIN 250 MG PO TABS: ORAL_TABLET | ORAL | 0 refills | Status: DC

## 2020-03-29 MED ORDER — FLUCONAZOLE 150 MG PO TABS: 150.0000 mg | ORAL_TABLET | Freq: Once | ORAL | 1 refills | Status: AC

## 2020-03-29 NOTE — Progress Notes (Signed)
Subjective:     Patient ID: [name] [name], female   DOB: 04-Sep-1950, 69 y.o.   MRN: [id number]  This visit type was conducted due to national recommendations for restrictions regarding the COVID-19 Pandemic (e.g. social distancing) in an effort to limit this patient's exposure and mitigate transmission in our community.  Due to their co-morbid illnesses, this patient is at least at moderate risk for complications without adequate follow up.  This format is felt to be most appropriate for this patient at this time.    Documentation for virtual audio and video telecommunications through [facility] encounter:  The patient was located at home. The provider was located in the office. The patient did consent to this visit and is aware of possible charges through their insurance for this visit.  The other persons participating in this telemedicine service were none. Time spent on call was 20 minutes and in review of previous records 20 minutes total.  This virtual service is not related to other E/M service within previous 7 days.   HPI Chief Complaint  Patient presents with  . Sore Throat    with cough and runny nose, fatigue  x3 weeks    Virtual consult for cough.   She notes ongoing fatigue, cough, sore throat. 3 weeks ago after being out in the chilly rain, she caught a cold.   Had chills x 1 day, then had low grade fever although thermometer said 98.8.   Had some cough, congestion, fatigue, sore throat.   symptoms lasted several days.  Used salt water gargles, cough syrup.  However still having lingering symptoms of sore throat, malaise, chills last night.   Has periodic cough.   Sometimes sneezing.   Using dayquil.  No sick contacts.  No other aggravating or relieving factors. No other complaint.  Past Medical History:  Diagnosis Date  . Anemia   . Arthritis   . Blood in stool    GI consult 2012, EGD and colonoscopy  . CAD (coronary artery disease)   . Eczema   . GERD  (gastroesophageal reflux disease)   . H/O bone density study 07/09/2006   [facility]; [facility]  . Hemorrhoid    internal and external, general surgery consult 04/2011  . Hyperlipidemia   . Hypertension   . Osteopenia     Review of Systems As in subjective    Objective:   Physical Exam Due to coronavirus pandemic stay at home measures, patient visit was virtual and they were not examined in person.   Ht 5\' 5"  (1.651 m)   Wt 210 lb (95.3 kg)   BMI 34.95 kg/m       Assessment:     Encounter Diagnoses  Name Primary?  . Cough Yes  . Sore throat   . Sinus congestion   . Respiratory tract infection        Plan:     Discussed symptoms, concerns.  She will come by our office back parking lot for covid test today.     I reviewed CT chest from 07/2019 and labs from that visit as well.  CT showing bronchitis changes but no mass, no PE, no pneumonia.   She seems pulmonology.  She will continue Breo, albuterol.    Begin Zpak if covid test negative.  She requested diflucan as a backup in the event of yeast infection from antibiotic.    C/t rest, hydration, and if not resolved or much improved within 5-7 days consider chest xray, labs.  [name] [name] was seen today for sore throat.  Diagnoses and all orders for this visit:  Cough -     POC COVID-19 BinaxNow; Future -     Novel Coronavirus, NAA ([facility]); Future  Sore throat -     POC COVID-19 BinaxNow; Future -     Novel Coronavirus, NAA ([facility]); Future  Sinus congestion -     POC COVID-19 BinaxNow; Future -     Novel Coronavirus, NAA ([facility]); Future  Respiratory tract infection -     POC COVID-19 BinaxNow; Future -     Novel Coronavirus, NAA ([facility]); Future  Other orders -     azithromycin (ZITHROMAX) 250 MG tablet; 2 tablets day 1, then 1 tablet days 2-4 -     fluconazole (DIFLUCAN) 150 MG tablet; Take 1 tablet (150 mg total) by mouth once for 1 dose.  f/u for covid testing

## 2020-03-30 LAB — SARS-COV-2, NAA 2 DAY TAT

## 2020-03-30 LAB — NOVEL CORONAVIRUS, NAA: SARS-CoV-2, NAA: NOT DETECTED

## 2020-04-05 ENCOUNTER — Telehealth: Payer: Self-pay

## 2020-04-05 NOTE — Telephone Encounter (Signed)
Pt. Scheduled for an in person f/u tomorrow.

## 2020-04-05 NOTE — Telephone Encounter (Signed)
Pt. Called back stating she saw you on the 23rd and you gave her some medicine which she finished up Saturday. She said she is still coughing a lot and wheezing is worse now, she is coughing up a lot of phlegm and wonders if she may have a viral infection. Wants to know what she needs to do next may need more medicine or different medicine.

## 2020-04-05 NOTE — Telephone Encounter (Signed)
Needs recheck in person at this point

## 2020-04-06 ENCOUNTER — Other Ambulatory Visit: Payer: Self-pay

## 2020-04-06 ENCOUNTER — Encounter: Payer: Self-pay | Admitting: Medical

## 2020-04-06 ENCOUNTER — Ambulatory Visit (HOSPITAL_COMMUNITY)
Admission: RE | Admit: 2020-04-06 | Discharge: 2020-04-06 | Disposition: A | Discharge status: Home / Self Care | Payer: Medicare PPO | Source: Ambulatory Visit | Attending: Medical | Admitting: Medical

## 2020-04-06 ENCOUNTER — Ambulatory Visit (INDEPENDENT_AMBULATORY_CARE_PROVIDER_SITE_OTHER): Payer: Medicare PPO | Admitting: Medical

## 2020-04-06 VITALS — BP 142/86 | HR 57 | Ht 64.0 in | Wt 225.8 lb

## 2020-04-06 DIAGNOSIS — R062 Wheezing: Secondary | ICD-10-CM | POA: Diagnosis not present

## 2020-04-06 DIAGNOSIS — H6593 Unspecified nonsuppurative otitis media, bilateral: Secondary | ICD-10-CM | POA: Diagnosis not present

## 2020-04-06 DIAGNOSIS — J988 Other specified respiratory disorders: Secondary | ICD-10-CM

## 2020-04-06 DIAGNOSIS — H6693 Otitis media, unspecified, bilateral: Secondary | ICD-10-CM | POA: Insufficient documentation

## 2020-04-06 DIAGNOSIS — R059 Cough, unspecified: Secondary | ICD-10-CM

## 2020-04-06 MED ORDER — HYDROCODONE-HOMATROPINE 5-1.5 MG/5ML PO SYRP: 5.0000 mL | ORAL_SOLUTION | Freq: Three times a day (TID) | ORAL | 0 refills | Status: AC | PRN

## 2020-04-06 MED ORDER — PREDNISONE 10 MG PO TABS: ORAL_TABLET | ORAL | 0 refills | Status: DC

## 2020-04-06 MED ORDER — FLUCONAZOLE 150 MG PO TABS: 150.0000 mg | ORAL_TABLET | Freq: Once | ORAL | 1 refills | Status: AC

## 2020-04-06 MED ORDER — LEVOFLOXACIN 500 MG PO TABS: 500.0000 mg | ORAL_TABLET | Freq: Every day | ORAL | 0 refills | Status: DC

## 2020-04-06 NOTE — Patient Instructions (Signed)
Recommendations:  Begin back on Breo 1 puff daily  Use albuterol rescue inhaler 2 puffs every 4-6 hours for shortness of breath, cough, wheezing or tightness in the chest  Begin round of Levaquin antibiotic given unresolved symptoms and ear infection findings  Rest  Continue to hydrate well with water  You can use the cough syrup I prescribed today  Begin round of prednisone as well, oral tablet to reduce inflammation in lungs

## 2020-04-06 NOTE — Progress Notes (Addendum)
Subjective:     Patient ID: [name] [name], female   DOB: 10-14-1950, 69 y.o.   MRN: [id number]  HPI Chief Complaint  Patient presents with  . Cough    with wheezing    Here for consult on cough, wheezing.  I did a virtual consult with her on 03/29/20 for same.  At that time she was negative for covid swab, and we did a round of zpak which helped.  She did get temporary improvement but not complete resolution of symptoms.  Currently she notes some nasal congestion, sniffles, cough.   No recent fever, no nausea, no vomiting.  Had some loose stool yesterday.  Appetite hasn;t returns since last visit but no change in taste or smell.  Does feel wheezy, worse over the weekend.  No sick contacts, lives alone.   Currently using inhaler, but ran out of OTC cough syrup.   Never smoking.  Has hx/o hay fever, but no hx/o asthma.   She notes last winter having to go to hospital for wheezing and respiratory infection.   She ended up seeing pulmonology as well as cardiology at that time.  Heart checked out fine that time, and she ended up improving with Breo and albuterol.   She hasn't been using Breo recently but has been using Albuterol.  No other aggravating or relieving factors. No other complaint.  Past Medical History:  Diagnosis Date  . Anemia   . Arthritis   . Blood in stool    GI consult 2012, EGD and colonoscopy  . CAD (coronary artery disease)   . Eczema   . GERD (gastroesophageal reflux disease)   . H/O bone density study 07/09/2006   osteopenia; ([facility]  . Hemorrhoid    internal and external, general surgery consult 04/2011  . Hyperlipidemia   . Hypertension   . Osteopenia     Review of Systems  Respiratory: Positive for cough.    As in subjective    Objective:   Physical Exam  BP (!) 142/86   Pulse (!) 57   Ht 5\' 4"  (1.626 m)   Wt 225 lb 12.8 oz (102.4 kg)   SpO2 95%   BMI 38.76 kg/m   Wt Readings from Last 3 Encounters:  04/06/20 225 lb 12.8 oz (102.4 kg)   03/29/20 210 lb (95.3 kg)  02/24/20 225 lb 12.8 oz (102.4 kg)   BP Readings from Last 3 Encounters:  04/06/20 (!) 142/86  02/24/20 126/82  02/10/20 128/78   General appearence: alert, no distress, WD/WN HEENT: normocephalic, sclerae anicteric, TMs with retraction and mild erythema bilat, nares patent, no discharge or erythema, pharynx normal Oral cavity: MMM, no lesions Neck: supple, no lymphadenopathy, no thyromegaly, no masses, no JVD Heart: RRR, normal S1, S2, no murmurs Lungs: scattered wheezes, but no rhonchi, or rales Abdomen: +bs, soft, non tender, non distended, no masses, no hepatomegaly, no splenomegaly Pulses: 2+ symmetric, upper and lower extremities, normal cap refill Ext: no edema, no calve tendnerss or asymmetry       Assessment:     Encounter Diagnoses  Name Primary?  . Cough Yes  . Wheezy   . Other nonsuppurative otitis media of both ears, unspecified chronicity   . Respiratory tract infection        Plan:     I reviewed her cardiology notes from 11/2019 with Dr. 12/2019.  At that time she complained of dyspnea, perfusion study was negative and echo demonstrated normal EF.  They felt like  her shortness of breath was related more to weight and deconditioning.  She does have coronary artery disease nonobstructive.  At that time they felt she needed no additional work-up to just work on primary risk reduction  She also saw pulmonology in August 2021.  I reviewed that note as well.  At that time she was prescribed Breo and albuterol which did help.  She has had COVID vaccination.  Recommendations:  Begin back on Breo 1 puff daily  Use albuterol rescue inhaler 2 puffs every 4-6 hours for shortness of breath, cough, wheezing or tightness in the chest  Begin round of Levaquin antibiotic given unresolved symptoms and ear infection findings.  Discussed risks and benefits of medication  Rest  Continue to hydrate well with water  You can use the cough syrup  I prescribed today  Begin round of prednisone as well, oral tablet to reduce inflammation in lungs  Diflucan prescribed at her request for possible secondary yeast infection being on antibotic  Go for [facility] x[facility] at [facility].    Jamee was seen today for cough.  Diagnoses and all orders for this visit:  Cough -     DG Chest 2 View; Future  Wheezy -     DG Chest 2 View; Future  Other nonsuppurative otitis media of both ears, unspecified chronicity -     DG Chest 2 View; Future  Respiratory tract infection -     DG Chest 2 View; Future  Other orders -     predniSONE (DELTASONE) 10 MG tablet; 6/5/4/3/2/1 taper -     HYDROcodone-homatropine (HYCODAN) 5-1.5 MG/5ML syrup; Take 5 mLs by mouth every 8 (eight) hours as needed for up to 5 days. -     levofloxacin (LEVAQUIN) 500 MG tablet; Take 1 tablet (500 mg total) by mouth daily. -     fluconazole (DIFLUCAN) 150 MG tablet; Take 1 tablet (150 mg total) by mouth once for 1 dose.  f/u with chest xray

## 2020-04-06 NOTE — Addendum Note (Signed)
Addended by: Jac Canavan on: 04/06/2020 09:15 AM   Modules accepted: Orders

## 2020-05-07 ENCOUNTER — Other Ambulatory Visit: Payer: Self-pay | Admitting: Cardiology

## 2020-05-07 DIAGNOSIS — E785 Hyperlipidemia, unspecified: Secondary | ICD-10-CM

## 2020-05-31 ENCOUNTER — Other Ambulatory Visit: Payer: Self-pay | Admitting: Family Medicine

## 2020-05-31 DIAGNOSIS — I10 Essential (primary) hypertension: Secondary | ICD-10-CM

## 2020-06-17 DIAGNOSIS — H401221 Low-tension glaucoma, left eye, mild stage: Secondary | ICD-10-CM | POA: Diagnosis not present

## 2020-06-18 ENCOUNTER — Other Ambulatory Visit: Payer: Self-pay | Admitting: Family Medicine

## 2020-06-18 DIAGNOSIS — I10 Essential (primary) hypertension: Secondary | ICD-10-CM

## 2020-07-01 DIAGNOSIS — M1990 Unspecified osteoarthritis, unspecified site: Secondary | ICD-10-CM | POA: Diagnosis not present

## 2020-07-01 DIAGNOSIS — E785 Hyperlipidemia, unspecified: Secondary | ICD-10-CM | POA: Diagnosis not present

## 2020-07-01 DIAGNOSIS — Z0000 Encounter for general adult medical examination without abnormal findings: Secondary | ICD-10-CM | POA: Diagnosis not present

## 2020-07-01 DIAGNOSIS — Z008 Encounter for other general examination: Secondary | ICD-10-CM | POA: Diagnosis not present

## 2020-07-01 DIAGNOSIS — M810 Age-related osteoporosis without current pathological fracture: Secondary | ICD-10-CM | POA: Diagnosis not present

## 2020-07-01 DIAGNOSIS — I10 Essential (primary) hypertension: Secondary | ICD-10-CM | POA: Diagnosis not present

## 2020-07-01 DIAGNOSIS — J302 Other seasonal allergic rhinitis: Secondary | ICD-10-CM | POA: Diagnosis not present

## 2020-07-01 DIAGNOSIS — Z6838 Body mass index (BMI) 38.0-38.9, adult: Secondary | ICD-10-CM | POA: Diagnosis not present

## 2020-07-16 ENCOUNTER — Other Ambulatory Visit: Payer: Self-pay | Admitting: Family Medicine

## 2020-07-18 NOTE — Telephone Encounter (Signed)
Looks like linzess was prescribed

## 2020-08-31 ENCOUNTER — Encounter: Payer: Self-pay | Admitting: Skilled Nursing Facility1

## 2020-08-31 ENCOUNTER — Other Ambulatory Visit: Payer: Self-pay

## 2020-08-31 ENCOUNTER — Encounter: Payer: Medicare PPO | Attending: Student | Admitting: Skilled Nursing Facility1

## 2020-08-31 DIAGNOSIS — E782 Mixed hyperlipidemia: Secondary | ICD-10-CM | POA: Diagnosis not present

## 2020-08-31 DIAGNOSIS — I1310 Hypertensive heart and chronic kidney disease without heart failure, with stage 1 through stage 4 chronic kidney disease, or unspecified chronic kidney disease: Secondary | ICD-10-CM | POA: Insufficient documentation

## 2020-08-31 DIAGNOSIS — K219 Gastro-esophageal reflux disease without esophagitis: Secondary | ICD-10-CM | POA: Diagnosis not present

## 2020-08-31 DIAGNOSIS — N183 Chronic kidney disease, stage 3 unspecified: Secondary | ICD-10-CM | POA: Diagnosis not present

## 2020-08-31 DIAGNOSIS — E669 Obesity, unspecified: Secondary | ICD-10-CM

## 2020-08-31 NOTE — Progress Notes (Signed)
Medical Nutrition Therapy  Appointment Start time:  2:00  Appointment End time:  3:15  Primary concerns today: weight loss Referral diagnosis: weight gain Preferred learning style: visual Learning readiness: ready   NUTRITION ASSESSMENT   Clinical Medical Hx: GERD, CKD stage 3 Medications:  Labs: WNL  Notable Signs/Symptoms: back pain  Lifestyle & Dietary Hx  Pt state she is allergic to cheese.  Pt states she dos have GERD taking medication and sometimes needs tums for breakthrough. Pt states she does take a gummy fiber supplement. Pt states he does have back pain which gets in the way of her standing for longer periods of time such as doing dishes and getting snacks out of the fridge. Pt states she does live alone. Pt states she does keep a chair in the kitchen for her to take breaks. Pt state she does not understand why she is gaining weight and feel sit could be her medication. Pt states she started gaining weight in 2020 when the pandemic hit starting at 196 pounds and now 230 pounds. Pt state she liked being 196 pounds and does not want to be any higher. Pt states in 2020 when she was about 208 pounds she was experiencing fatigue and shortness of breath. Pt states more recently the shortness of breath and fatigue has improved some but not completely.  Pt states she does not see where she could have gained weight from overconsumption due to her limited appetite. Pt state she does bible study.   Pt state she does not drink as much water when she is out of the house due to over active bladder.  Pt state she is up at 5:30 am each morning.   Estimated daily fluid intake: unknown oz Supplements: elderberry, fiber gummy, tumeric, vitmain D, vitamin C, mutlivitmain, b12, calcium, vitamin E, fish oil, Iron Sleep: enough Stress / self-care: slightly limited due to physical pain Current average weekly physical activity: line dancing once a week  24-Hr Dietary Recall First Meal:   Snack: Second Meal 11am: raisin bran + unsweetened almond milk + fried apples or 1 packet oatmeal + milk  Snack: fruit in light syrup  Third Meal 6pm: spanish rice + salmon Snack 9pm: peanut butter sandwich Beverages: milk, cranberry juice, sparkling cider, 8 glasses water  Estimated Energy Needs Calories: 1500   NUTRITION INTERVENTION  Nutrition education (E-1) on the following topics:  . Independence as we age . The importance of lean muscle mass . Appropriate caloric value of meals/snacks  Handouts Provided Include   Detailed MyPlate  Learning Style & Readiness for Change Teaching method utilized: Visual & Auditory  Demonstrated degree of understanding via: Teach Back  Barriers to learning/adherence to lifestyle change: none identified  Goals Established by Pt . Take your calcium and iron at least 2 hours a part . Limit your cranberry juice to 4 ounces per day . For your 9pm snack something light such as yogurt or no sugar applesauce  . Try breakfast at 8am and then lunch at 1pm . Ensure to have a protein source with breakfast and lunch . Ensure you are eating non starchy vegetables 2 times a day 7 days a week . Do not drink the syrup from your fruits try to get fresh options  . Great job on your water! . Aim for activity 4 days a week like stationary bike and resistance bands   MONITORING & EVALUATION Dietary intake, weekly physical activity  Next Steps  Patient is to call if needed.

## 2020-10-28 ENCOUNTER — Telehealth (HOSPITAL_COMMUNITY): Payer: Self-pay

## 2020-10-28 ENCOUNTER — Other Ambulatory Visit (HOSPITAL_COMMUNITY): Payer: Self-pay | Admitting: *Deleted

## 2020-10-28 DIAGNOSIS — R1310 Dysphagia, unspecified: Secondary | ICD-10-CM

## 2020-10-28 NOTE — Telephone Encounter (Signed)
Attempted to contact patient to schedule OP MBS - left voicemail. ?

## 2020-11-03 ENCOUNTER — Other Ambulatory Visit: Payer: Self-pay

## 2020-11-03 ENCOUNTER — Ambulatory Visit (HOSPITAL_COMMUNITY)
Admission: RE | Admit: 2020-11-03 | Discharge: 2020-11-03 | Disposition: A | Discharge status: Home / Self Care | Payer: Medicare PPO | Source: Ambulatory Visit | Attending: Student | Admitting: Student

## 2020-11-03 DIAGNOSIS — R1310 Dysphagia, unspecified: Secondary | ICD-10-CM | POA: Insufficient documentation

## 2020-11-03 NOTE — Progress Notes (Signed)
Modified Barium Swallow Progress Note  Patient Details  Name: [name] [name] MRN: [id number] Date of Birth: 14-Jul-1950  Today's Date: 11/03/2020  Modified Barium Swallow completed.  Full report located under Chart Review in the Imaging Section.  Brief recommendations include the following:  Clinical Impression  Pt demonstrates no oropharygneal dysphagia. There was no aspiration during study. Only one instance of trace flash penetration to the vocal folds when drinking liquids to transit barium tablet. Used video to demonstrate swallowing mechanism to pt and explain the probability she is having trace premature spillage with liquids at times, eliciting a hard protective cough. We discussed how coughing can be distressing, but it is the body's way of protecting the airway. Additionally explained that trace aspiration or penetration events will not cause asphyxiation and as long as she is not developing pna, her problem is not signficantly harmful. Suggested a chin tuck when trying juicy fruits or even drinking to reduce coughing. Laryngeal sensitivity from GER may also increase severity of cough. No SLP f/u needed at this time.   Swallow Evaluation Recommendations       SLP Diet Recommendations: Regular solids;Thin liquid   Liquid Administration via: Straw;Cup   Medication Administration: Whole meds with liquid   Supervision: Patient able to self feed   Compensations: Slow rate;Small sips/bites   Postural Changes: Remain semi-upright after after feeds/meals (Comment);Seated upright at 90 degrees           Harlon Ditty, MA CCC-SLP  Acute Rehabilitation Services Office [phone]  Claudine Mouton 11/03/2020,12:56 PM

## 2020-11-14 ENCOUNTER — Encounter (HOSPITAL_COMMUNITY): Payer: Self-pay | Admitting: *Deleted

## 2020-11-14 ENCOUNTER — Emergency Department (HOSPITAL_COMMUNITY)
Admission: EM | Admit: 2020-11-14 | Discharge: 2020-11-14 | Disposition: A | Discharge status: Home / Self Care | Payer: Medicare PPO | Attending: Emergency Medicine | Admitting: Emergency Medicine

## 2020-11-14 ENCOUNTER — Other Ambulatory Visit: Payer: Self-pay

## 2020-11-14 ENCOUNTER — Emergency Department (HOSPITAL_COMMUNITY): Payer: Medicare PPO

## 2020-11-14 DIAGNOSIS — Z7982 Long term (current) use of aspirin: Secondary | ICD-10-CM | POA: Diagnosis not present

## 2020-11-14 DIAGNOSIS — H938X3 Other specified disorders of ear, bilateral: Secondary | ICD-10-CM | POA: Diagnosis not present

## 2020-11-14 DIAGNOSIS — R0789 Other chest pain: Secondary | ICD-10-CM | POA: Diagnosis not present

## 2020-11-14 DIAGNOSIS — I10 Essential (primary) hypertension: Secondary | ICD-10-CM | POA: Insufficient documentation

## 2020-11-14 DIAGNOSIS — I2510 Atherosclerotic heart disease of native coronary artery without angina pectoris: Secondary | ICD-10-CM | POA: Insufficient documentation

## 2020-11-14 DIAGNOSIS — Z79899 Other long term (current) drug therapy: Secondary | ICD-10-CM | POA: Insufficient documentation

## 2020-11-14 LAB — TROPONIN I (HIGH SENSITIVITY)
Troponin I (High Sensitivity): 7 ng/L (ref <18)
Troponin I (High Sensitivity): 8 ng/L (ref <18)

## 2020-11-14 LAB — CBC
HCT: 42.4 % (ref 36.0–46.0)
Hemoglobin: 13.6 g/dL (ref 12.0–15.0)
MCH: 28.7 pg (ref 26.0–34.0)
MCHC: 32.1 g/dL (ref 30.0–36.0)
MCV: 89.5 fL (ref 80.0–100.0)
Platelets: 310 10*3/uL (ref 150–400)
RBC: 4.74 MIL/uL (ref 3.87–5.11)
RDW: 15.8 % — ABNORMAL HIGH (ref 11.5–15.5)
WBC: 8.7 10*3/uL (ref 4.0–10.5)
nRBC: 0 % (ref 0.0–0.2)

## 2020-11-14 LAB — BASIC METABOLIC PANEL
Anion gap: 10 (ref 5–15)
BUN: 13 mg/dL (ref 8–23)
CO2: 27 mmol/L (ref 22–32)
Calcium: 9.1 mg/dL (ref 8.9–10.3)
Chloride: 101 mmol/L (ref 98–111)
Creatinine, Ser: 1.17 mg/dL — ABNORMAL HIGH (ref 0.44–1.00)
GFR, Estimated: 51 mL/min — ABNORMAL LOW (ref >60)
Glucose, Bld: 114 mg/dL — ABNORMAL HIGH (ref 70–99)
Potassium: 3.9 mmol/L (ref 3.5–5.1)
Sodium: 138 mmol/L (ref 135–145)

## 2020-11-14 MED ORDER — SUCRALFATE 1 G PO TABS: 1.0000 g | ORAL_TABLET | Freq: Three times a day (TID) | ORAL | 0 refills | Status: DC

## 2020-11-14 MED ORDER — OMEPRAZOLE 20 MG PO CPDR: 20.0000 mg | DELAYED_RELEASE_CAPSULE | Freq: Every day | ORAL | 0 refills | Status: DC

## 2020-11-14 MED ORDER — ASPIRIN 81 MG PO CHEW
243.0000 mg | CHEWABLE_TABLET | Freq: Once | ORAL | Status: AC
  Administered 2020-11-14: 243 mg via ORAL
  Filled 2020-11-14: qty 3

## 2020-11-14 NOTE — ED Provider Notes (Signed)
Emergency Medicine Provider Triage Evaluation Note  [name] , a 70 y.o. female  was evaluated in triage.  Pt complains of mid sternal CP onset yesterday morning upon waking, tried Tums and rx meds for reflux without relief. Radiates to back, intermittent, with SHOB.  Review of Systems  Positive: CP, SHOB, nausea Negative: Abdominal pain, vomiting,   Physical Exam  BP 133/75 (BP Location: Left Arm)   Pulse 67   Temp 98.4 F (36.9 C) (Oral)   Resp 16   SpO2 96%  Gen:   Awake, no distress   Resp:  Normal effort  MSK:   Moves extremities without difficulty  Other:  No chest wall tenderness, no abdominal pain, no lower ext edema  Medical Decision Making  Medically screening exam initiated at 10:40 AM.  Appropriate orders placed.  [name] was informed that the remainder of the evaluation will be completed by another provider, this initial triage assessment does not replace that evaluation, and the importance of remaining in the ED until their evaluation is complete.     Jeannie Fend, PA-C 11/14/20 1050    Gerhard Munch, MD 11/14/20 1257

## 2020-11-14 NOTE — Discharge Instructions (Addendum)
As discussed, your evaluation today has been largely reassuring.  But, it is important that you monitor your condition carefully, and do not hesitate to return to the ED if you develop new, or concerning changes in your condition. ? ?Otherwise, please follow-up with your physician for appropriate ongoing care. ? ?

## 2020-11-14 NOTE — ED Provider Notes (Signed)
[facility] Provider Note   CSN: [id number] Arrival date & time: 11/14/20  1026     History Chief Complaint  Patient presents with   Chest Pain    Kaho Brooke DareJ Morera is a 70 y.o. female.  HPI Patient presents with concern for chest tightness.  She is currently asymptomatic.  She has a history of GERD, CAD.  She notes that yesterday after eating she felt chest tightness, took Tums, did not have substantial improvement, but today has had no symptoms.  There is associated nausea, but no vomiting, no diarrhea.  Though she has no symptoms of chest pain, dyspnea, nausea, vomiting she does have some bilateral ear fullness and today presents for evaluation.    Past Medical History:  Diagnosis Date   Anemia    Arthritis    Blood in stool    GI consult 2012, EGD and colonoscopy   CAD (coronary artery disease)    Eczema    GERD (gastroesophageal reflux disease)    H/O bone density study 07/09/2006   [facility]openia; [facility])   Hemorrhoid    internal and external, general surgery consult 04/2011   Hyperlipidemia    Hypertension    Osteopenia     Patient Active Problem List   Diagnosis Date Noted   Cough 04/06/2020   Wheezy 04/06/2020   Bilateral otitis media 04/06/2020   Respiratory tract infection 04/06/2020   Atherosclerosis of aorta (HCC) 02/10/2020   Constipation 02/10/2020   Obesity (BMI 30-39.9) 02/10/2020   Educated about COVID-19 virus infection 11/12/2019   Coronary artery disease involving native coronary artery of native heart without angina pectoris 11/12/2019   Arthritis 01/28/2019   OAB (overactive bladder) 01/28/2019   Hx of adenomatous colonic polyps 12/13/2017   Essential hypertension, benign 10/16/2011   Hyperlipidemia 10/16/2011   Osteopenia 10/16/2011   Hemorrhoids 10/16/2011   GERD (gastroesophageal reflux disease) 10/16/2011    Past Surgical History:  Procedure Laterality Date   ABDOMINAL HYSTERECTOMY      COLONOSCOPY  2012, 2008   Dr. Loreta AveMann   ESOPHAGOGASTRODUODENOSCOPY  2012, 2008   Dr. Loreta AveMann     OB History   No obstetric history on file.     Family History  Problem Relation Age of Onset   Heart disease Mother        Pacemaker   Diabetes Mother    Cancer Father        lung   Diabetes Father    Diabetes Sister    Heart disease Sister    Hypertension Sister    Diabetes Brother    Heart disease Brother        1 brother died MI age 69 yo   Hypertension Brother    Stroke Brother    Cancer Other        maternal side,non first degree breast cancer   Hypertension Brother    Diabetes Brother    Heart disease Brother    Heart disease Brother    Hypertension Brother    Diabetes Brother    Hypertension Sister    Hypertension Sister    Diabetes Sister    Diabetes Sister    Hypertension Sister    Hypertension Sister    Diabetes Sister     Social History   Tobacco Use   Smoking status: Never   Smokeless tobacco: Never  Substance Use Topics   Alcohol use: No   Drug use: No    Home Medications Prior to Admission  medications   Medication Sig Start Date End Date Taking? Authorizing Provider  albuterol (VENTOLIN HFA) 108 (90 Base) MCG/ACT inhaler Inhale 2 puffs into the lungs every 6 (six) hours as needed for wheezing or shortness of breath. 06/18/19  Yes Olalere, Adewale A, MD  Ascorbic Acid (VITAMIN C) 100 MG CHEW Chew 1 tablet by mouth daily.   Yes [provider]  aspirin 81 MG tablet Take 81 mg by mouth daily.   Yes [provider]  atorvastatin (LIPITOR) 80 MG tablet TAKE 1 TABLET(80 MG) BY MOUTH DAILY 05/09/20  Yes Hochrein, Fayrene Fearing, MD  bimatoprost (LUMIGAN) 0.01 % SOLN Place 1 drop into the left eye at bedtime.   Yes [provider]  bisoprolol-hydrochlorothiazide (ZIAC) 5-6.25 MG tablet TAKE 2 TABLETS BY MOUTH DAILY. 06/20/20  Yes Ronnald Nian, MD  felodipine (PLENDIL) 10 MG 24 hr tablet TAKE 1 TABLET(10 MG) BY MOUTH DAILY 06/01/20  Yes  Ronnald Nian, MD  FIBER PO Take 500 mg by mouth daily.   Yes [provider]  fluticasone furoate-vilanterol (BREO ELLIPTA) 100-25 MCG/INH AEPB Inhale 1 puff into the lungs daily. Patient taking differently: Inhale 1 puff into the lungs daily as needed (sob). 07/27/19  Yes Olalere, Adewale A, MD  IRON PO Take 1,000 mg by mouth daily at 6 (six) AM.   Yes [provider]  linaclotide (LINZESS) 145 MCG CAPS capsule Take 145 mcg by mouth daily before breakfast.   Yes [provider]  Multiple Vitamin (MULTIVITAMIN) capsule Take 1 capsule by mouth daily.     Yes [provider]  Omega-3 Fatty Acids (FISH OIL PO) Take 2 capsules by mouth daily.   Yes [provider]  omeprazole (PRILOSEC) 20 MG capsule Take 20 mg by mouth daily. 10/12/20  Yes [provider]  vitamin E 400 UNIT capsule Take 400 Units by mouth daily.   Yes [provider]  XIIDRA 5 % SOLN Apply 1 drop to eye 2 (two) times daily. 06/23/20  Yes [provider]  azithromycin (ZITHROMAX) 250 MG tablet 2 tablets day 1, then 1 tablet days 2-4 Patient not taking: No sig reported 03/29/20   Tysinger, Kermit Balo, PA-C  dexlansoprazole (DEXILANT) 60 MG capsule Take 1 capsule (60 mg total) by mouth daily. Patient not taking: No sig reported 01/28/19   Ronnald Nian, MD  levofloxacin (LEVAQUIN) 500 MG tablet Take 1 tablet (500 mg total) by mouth daily. Patient not taking: No sig reported 04/06/20   Tysinger, Kermit Balo, PA-C  mirabegron ER (MYRBETRIQ) 50 MG TB24 tablet Myrbetriq 50 mg tablet,extended release  TK 1 T PO QD Patient not taking: No sig reported    [provider]  predniSONE (DELTASONE) 10 MG tablet 6/5/4/3/2/1 taper Patient not taking: No sig reported 04/06/20   Tysinger, Kermit Balo, PA-C    Allergies    Crestor [rosuvastatin] and Penicillins  Review of Systems   Review of Systems  Constitutional:        Per HPI, otherwise negative  HENT:         Per  HPI, otherwise negative  Respiratory:         Per HPI, otherwise negative  Cardiovascular:        Per HPI, otherwise negative  Gastrointestinal:  Positive for nausea. Negative for vomiting.  Endocrine:       Negative aside from HPI  Genitourinary:        Neg aside from HPI   Musculoskeletal:  Per HPI, otherwise negative  Skin: Negative.   Neurological:  Negative for syncope.   Physical Exam Updated Vital Signs BP (!) 98/51   Pulse (!) 31   Temp 98.4 F (36.9 C) (Oral)   Resp (!) 25   SpO2 99%   Physical Exam Vitals and nursing note reviewed.  Constitutional:      General: She is not in acute distress.    Appearance: She is well-developed.  HENT:     Head: Normocephalic and atraumatic.     Right Ear: Tympanic membrane normal.     Left Ear: Tympanic membrane normal.  Eyes:     Conjunctiva/sclera: Conjunctivae normal.  Cardiovascular:     Rate and Rhythm: Normal rate and regular rhythm.  Pulmonary:     Effort: Pulmonary effort is normal. No respiratory distress.     Breath sounds: Normal breath sounds. No stridor.  Abdominal:     General: There is no distension.  Skin:    General: Skin is warm and dry.  Neurological:     Mental Status: She is alert and oriented to person, place, and time.     Cranial Nerves: No cranial nerve deficit.    ED Results / Procedures / Treatments   Labs (all labs ordered are listed, but only abnormal results are displayed) Labs Reviewed  BASIC METABOLIC PANEL - Abnormal; Notable for the following components:      Result Value   Glucose, Bld 114 (*)    Creatinine, Ser 1.17 (*)    GFR, Estimated 51 (*)    All other components within normal limits  CBC - Abnormal; Notable for the following components:   RDW 15.8 (*)    All other components within normal limits  TROPONIN I (HIGH SENSITIVITY)  TROPONIN I (HIGH SENSITIVITY)    EKG EKG Interpretation  Date/Time:  Monday November 14 2020 10:34:30 EDT Ventricular Rate:  63 PR  Interval:  140 QRS Duration: 70 QT Interval:  402 QTC Calculation: 411 R Axis:   26 Text Interpretation: Normal sinus rhythm Normal ECG Confirmed by Gerhard Munch [phone]) on 11/14/2020 11:54:34 AM  Radiology DG Chest Port 1 View  Result Date: 11/14/2020 CLINICAL DATA:  Short of breath EXAM: PORTABLE CHEST 1 VIEW COMPARISON:  None. FINDINGS: Normal mediastinum and cardiac silhouette. Normal pulmonary vasculature. No evidence of effusion, infiltrate, or pneumothorax. No acute bony abnormality. IMPRESSION: Insert chest radiograph. Electronically Signed   By: Genevive Bi M.D.   On: 11/14/2020 12:21    Procedures Procedures   Medications Ordered in ED Medications  aspirin chewable tablet 243 mg (243 mg Oral Given 11/14/20 1207)    ED Course  I have reviewed the triage vital signs and the nursing notes.  Pertinent labs & imaging results that were available during my care of the patient were reviewed by me and considered in my medical decision making (see chart for details). Cardiac 60s sinus normal Pulse ox 99% room air normal   2:38 PM Patient in no distress, awake, alert, speaking clearly.  Vital signs remain about the same, she has developed no new complaints.  She has had 2 normal troponin, nonischemic EKG, unremarkable chest x-ray, and absent any ongoing pain, low suspicion for atypical ACS.   Myocardial perfusion study last year results below:The left ventricular ejection fraction is normal (55-65%). Nuclear stress EF: 61%. No wall motion abnormality. The study is normal. No perfusion defects. This is a low risk study.   Per interpretation no further testing indicated at that  time. MDM Rules/Calculators/A&P MDM Number of Diagnoses or Management Options Atypical chest pain: new, needed workup   Amount and/or Complexity of Data Reviewed Clinical lab tests: ordered and reviewed Tests in the radiology section of CPT: ordered and reviewed Tests in the medicine section of  CPT: reviewed and ordered Decide to obtain previous medical records or to obtain history from someone other than the patient: yes Review and summarize past medical records: yes Independent visualization of images, tracings, or specimens: yes  Risk of Complications, Morbidity, and/or Mortality Presenting problems: high Diagnostic procedures: high Management options: high  Critical Care Total time providing critical care: < 30 minutes  Patient Progress Patient progress: stable   Final Clinical Impression(s) / ED Diagnoses Final diagnoses:  Atypical chest pain    Rx / DC Orders ED Discharge Orders     None        Gerhard Munch, MD 11/14/20 1446

## 2020-11-14 NOTE — ED Triage Notes (Signed)
Pt reports waking up with sharp mid chest pain yesterday morning. Also has pain into her back. No relief with otc antacids yesterday.

## 2020-12-08 ENCOUNTER — Other Ambulatory Visit: Payer: Self-pay | Admitting: Pulmonary Disease

## 2020-12-10 DIAGNOSIS — R072 Precordial pain: Secondary | ICD-10-CM | POA: Insufficient documentation

## 2020-12-10 NOTE — Progress Notes (Signed)
Cardiology Office Note   Date:  12/12/2020   ID:  [name] [name] December 11, 1950, MRN [id number]  PCP:  Hillery Aldo, NP  Cardiologist:   None    Chief Complaint  Patient presents with   Chest Pain       History of Present Illness: [name] [name] is a 70 y.o. female who presented for evaluation of shortness of breath.  She had a normal echo in 2006.  She had a cardiac catheterization in 2006 with 40-50% proximal LAD stenosis and mild plaquing elsewhere I saw her in 2015 for evaluation of SOB.  She had a perfusion study in 2015 that was normal.  Echo in March demonstrated an EF of 60%.  She also had a perfusion study in January 2021.  There was no ischemia on this.  She had pulmonary function testing in April with no significant findings that she has seen pulmonary.    She was recently in the ED with chest pain.  There was no evidence of an ischemic etiology.  I reviewed these records for this visit.  The chest discomfort ultimately was found to be GI and she has been managed for this.  She says this is improved.  She did see her gastroenterologist.  She is no longer getting any chest pressure, neck or arm discomfort.  The dyspnea that we worked up progressively previously seems to be somewhat improved.  Has been treated with Breo.  She is able to do some line dancing without bringing on any chest discomfort.  She denies any PND or orthopnea though she sleeps with her head propped up because of her reflux.  Has had no weight gain or edema.  Past Medical History:  Diagnosis Date   Anemia    Arthritis    Blood in stool    GI consult 2012, EGD and colonoscopy   CAD (coronary artery disease)    Eczema    GERD (gastroesophageal reflux disease)    H/O bone density study 07/09/2006   [facility]; [facility]   Hemorrhoid    internal and external, general surgery consult 04/2011   Hyperlipidemia    Hypertension    Osteopenia     Past Surgical History:  Procedure Laterality Date    ABDOMINAL HYSTERECTOMY     COLONOSCOPY  2012, 2008   Dr. Loreta Ave   ESOPHAGOGASTRODUODENOSCOPY  2012, 2008   Dr. Loreta Ave     Current Outpatient Medications  Medication Sig Dispense Refill   albuterol (VENTOLIN HFA) 108 (90 Base) MCG/ACT inhaler Inhale 2 puffs into the lungs every 6 (six) hours as needed for wheezing or shortness of breath. 18 g 11   Ascorbic Acid (VITAMIN C) 100 MG CHEW Chew 1 tablet by mouth daily.     aspirin 81 MG tablet Take 81 mg by mouth daily.     atorvastatin (LIPITOR) 80 MG tablet TAKE 1 TABLET(80 MG) BY MOUTH DAILY 90 tablet 3   bimatoprost (LUMIGAN) 0.01 % SOLN Place 1 drop into the left eye at bedtime.     bisoprolol-hydrochlorothiazide (ZIAC) 5-6.25 MG tablet TAKE 2 TABLETS BY MOUTH DAILY. 180 tablet 1   felodipine (PLENDIL) 10 MG 24 hr tablet TAKE 1 TABLET(10 MG) BY MOUTH DAILY 90 tablet 3   FIBER PO Take 500 mg by mouth daily.     fluticasone furoate-vilanterol (BREO ELLIPTA) 100-25 MCG/INH AEPB INHALE 1 PUFF INTO THE LUNGS DAILY 60 each 5   IRON PO Take 1,000 mg by mouth daily at  6 (six) AM.     linaclotide (LINZESS) 145 MCG CAPS capsule Take 145 mcg by mouth daily before breakfast.     Multiple Vitamin (MULTIVITAMIN) capsule Take 1 capsule by mouth daily.       Omega-3 Fatty Acids (FISH OIL PO) Take 2 capsules by mouth daily.     omeprazole (PRILOSEC) 20 MG capsule Take 1 capsule (20 mg total) by mouth daily. 30 capsule 0   sucralfate (CARAFATE) 1 g tablet Take 1 tablet (1 g total) by mouth 4 (four) times daily -  with meals and at bedtime. (Patient taking differently: Take 1 g by mouth 4 (four) times daily -  with meals and at bedtime. Patient takes as needed) 21 tablet 0   vitamin E 400 UNIT capsule Take 400 Units by mouth daily.     XIIDRA 5 % SOLN Apply 1 drop to eye 2 (two) times daily.     azithromycin (ZITHROMAX) 250 MG tablet 2 tablets day 1, then 1 tablet days 2-4 (Patient not taking: Reported on 12/12/2020) 6 tablet 0   dexlansoprazole (DEXILANT) 60  MG capsule Take 1 capsule (60 mg total) by mouth daily. (Patient not taking: Reported on 12/12/2020) 90 capsule 3   levofloxacin (LEVAQUIN) 500 MG tablet Take 1 tablet (500 mg total) by mouth daily. (Patient not taking: Reported on 12/12/2020) 7 tablet 0   mirabegron ER (MYRBETRIQ) 50 MG TB24 tablet Myrbetriq 50 mg tablet,extended release  TK 1 T PO QD (Patient not taking: Reported on 12/12/2020)     predniSONE (DELTASONE) 10 MG tablet 6/5/4/3/2/1 taper (Patient not taking: Reported on 12/12/2020) 21 tablet 0   No current facility-administered medications for this visit.    Allergies:   Crestor [rosuvastatin] and Penicillins    ROS:  Please see the history of present illness.   Otherwise, review of systems are positive for none.   All other systems are reviewed and negative.    PHYSICAL EXAM: VS:  BP 118/76 (BP Location: Left Arm, Patient Position: Sitting, Cuff Size: Large)   Pulse 61   Ht 5\' 5"  (1.651 m)   Wt 233 lb 9.6 oz (106 kg)   SpO2 97%   BMI 38.87 kg/m  , BMI Body mass index is 38.87 kg/m. GENERAL:  Well appearing NECK:  No jugular venous distention, waveform within normal limits, carotid upstroke brisk and symmetric, no bruits, no thyromegaly LUNGS:  Clear to auscultation bilaterally CHEST:  Unremarkable HEART:  PMI not displaced or sustained,S1 and S2 within normal limits, no S3, no S4, no clicks, no rubs, no murmurs ABD:  Flat, positive bowel sounds normal in frequency in pitch, no bruits, no rebound, no guarding, no midline pulsatile mass, no hepatomegaly, no splenomegaly EXT:  2 plus pulses throughout, no edema, no cyanosis no clubbing  EKG:  EKG is not ordered today. The ekg ordered 11/14/2020 demonstrates sinus rhythm, rate 63, axis within normal limits, intervals within normal limits, no acute ST-T wave changes.   Recent Labs: 11/14/2020: BUN 13; Creatinine, Ser 1.17; Hemoglobin 13.6; Platelets 310; Potassium 3.9; Sodium 138    Lipid Panel    Component Value Date/Time    CHOL 179 11/13/2019 1138   TRIG 158 (H) 11/13/2019 1138   HDL 51 11/13/2019 1138   CHOLHDL 3.5 11/13/2019 1138   CHOLHDL 2.8 11/09/2016 0850   VLDL 29 11/09/2016 0850   LDLCALC 100 (H) 11/13/2019 1138      Wt Readings from Last 3 Encounters:  12/12/20 233 lb 9.6 oz (106  kg)  08/31/20 230 lb (104.3 kg)  04/06/20 225 lb 12.8 oz (102.4 kg)      Other studies Reviewed: Additional studies/ records that were reviewed today include: Hospital records Review of the above records demonstrates:  Please see elsewhere in the note.     ASSESSMENT AND PLAN:  CHEST PAIN: Her chest pain was atypical for angina.  I would agree that it was a nonanginal etiology probably GI.  It seems to have improved.  I did review the records and there was no objective evidence of ischemia.  She had a negative perfusion study as above.  No further testing is indicated.  DYSPNEA:   This is improved.  No change in therapy.  CAD: She has nonobstructive coronary disease and we will continue with risk reduction.    WEIGHT: We talked about specifics of an exercise regimen.    HTN: The blood pressure is at target.  No change in therapy.   DYSLIPIDEMIA: I was able to find most recent lipids with an LDL of 95 and an HDL of 60.  She can continue with meds as listed and we talked about diet for primary risk reduction.   Current medicines are reviewed at length with the patient today.  The patient does not have concerns regarding medicines.  The following changes have been made:  None  Labs/ tests ordered today include:  None  No orders of the defined types were placed in this encounter.    Disposition:   FU with 12 months    Signed, Rollene Rotunda, MD  12/12/2020 3:00 PM    [facility]

## 2020-12-12 ENCOUNTER — Other Ambulatory Visit: Payer: Self-pay

## 2020-12-12 ENCOUNTER — Ambulatory Visit: Payer: Medicare PPO | Admitting: Cardiology

## 2020-12-12 ENCOUNTER — Encounter: Payer: Self-pay | Admitting: Cardiology

## 2020-12-12 VITALS — BP 118/76 | HR 61 | Ht 65.0 in | Wt 233.6 lb

## 2020-12-12 DIAGNOSIS — R072 Precordial pain: Secondary | ICD-10-CM

## 2020-12-12 DIAGNOSIS — E785 Hyperlipidemia, unspecified: Secondary | ICD-10-CM | POA: Diagnosis not present

## 2020-12-12 DIAGNOSIS — I10 Essential (primary) hypertension: Secondary | ICD-10-CM

## 2020-12-12 NOTE — Patient Instructions (Signed)
Medication Instructions:  No changes *If you need a refill on your cardiac medications before your next appointment, please call your pharmacy*   Lab Work: None ordered If you have labs (blood work) drawn today and your tests are completely normal, you will receive your results only by: MyChart Message (if you have MyChart) OR A paper copy in the mail If you have any lab test that is abnormal or we need to change your treatment, we will call you to review the results.   Testing/Procedures: None ordered   Follow-Up: At [facility], you and your health needs are our priority.  As part of our continuing mission to provide you with exceptional heart care, we have created designated Provider Care Teams.  These Care Teams include your primary Cardiologist (physician) and Advanced Practice Providers (APPs -  Physician Assistants and Nurse Practitioners) who all work together to provide you with the care you need, when you need it.  We recommend signing up for the patient portal called "MyChart".  Sign up information is provided on this After Visit Summary.  MyChart is used to connect with patients for Virtual Visits (Telemedicine).  Patients are able to view lab/test results, encounter notes, upcoming appointments, etc.  Non-urgent messages can be sent to your provider as well.   To learn more about what you can do with MyChart, go to [email or web address].    Your next appointment:   12 month(s)  The format for your next appointment:   In Person  Provider:   You may see Dr. Hochrein or one of the following Advanced Practice Providers on your designated Care Team:   Rhonda Barrett, PA-C Jennifer, Lambert, PA-C Kathryn Lawrence, DNP, ANP    

## 2020-12-13 ENCOUNTER — Telehealth: Payer: Self-pay | Admitting: Pulmonary Disease

## 2020-12-13 NOTE — Telephone Encounter (Signed)
Call made to pharmacy, confirmed patient info. They do have the refill prescription and told us to make patient aware to check this afternoon to see if it is ready.   Call made to patient, confirmed DOB. Aware script will be ready this afternoon and to be sure to call pharmacy first. Voiced understanding.   Nothing further needed at this time.

## 2020-12-13 NOTE — Telephone Encounter (Signed)
Pt is requesting a medication refill for; Breo Ellipta inhaler. Did inform pt that it does appear filled as of yesterday (12/12/2020) but she stated that the pharmacy did not get the order she had called them before calling us back.  Pharmacy; [facility] [phone] - [place], [place] - [address]  [address],  [place] [address]   Pls regard; [phone]

## 2020-12-15 ENCOUNTER — Ambulatory Visit: Payer: Medicare PPO | Admitting: Cardiology

## 2021-01-04 ENCOUNTER — Other Ambulatory Visit: Payer: Self-pay | Admitting: Family Medicine

## 2021-01-04 DIAGNOSIS — I10 Essential (primary) hypertension: Secondary | ICD-10-CM

## 2021-01-05 ENCOUNTER — Emergency Department (HOSPITAL_COMMUNITY): Payer: Medicare PPO

## 2021-01-05 ENCOUNTER — Other Ambulatory Visit: Payer: Self-pay

## 2021-01-05 ENCOUNTER — Emergency Department (HOSPITAL_COMMUNITY)
Admission: EM | Admit: 2021-01-05 | Discharge: 2021-01-05 | Disposition: A | Discharge status: Home / Self Care | Payer: Medicare PPO | Attending: Emergency Medicine | Admitting: Emergency Medicine

## 2021-01-05 DIAGNOSIS — Z7982 Long term (current) use of aspirin: Secondary | ICD-10-CM | POA: Diagnosis not present

## 2021-01-05 DIAGNOSIS — W010XXA Fall on same level from slipping, tripping and stumbling without subsequent striking against object, initial encounter: Secondary | ICD-10-CM | POA: Insufficient documentation

## 2021-01-05 DIAGNOSIS — M25572 Pain in left ankle and joints of left foot: Secondary | ICD-10-CM | POA: Diagnosis not present

## 2021-01-05 DIAGNOSIS — I119 Hypertensive heart disease without heart failure: Secondary | ICD-10-CM | POA: Diagnosis not present

## 2021-01-05 DIAGNOSIS — S89292A Other physeal fracture of upper end of left fibula, initial encounter for closed fracture: Secondary | ICD-10-CM | POA: Diagnosis not present

## 2021-01-05 DIAGNOSIS — Y9301 Activity, walking, marching and hiking: Secondary | ICD-10-CM | POA: Insufficient documentation

## 2021-01-05 DIAGNOSIS — I2510 Atherosclerotic heart disease of native coronary artery without angina pectoris: Secondary | ICD-10-CM | POA: Insufficient documentation

## 2021-01-05 DIAGNOSIS — Y92009 Unspecified place in unspecified non-institutional (private) residence as the place of occurrence of the external cause: Secondary | ICD-10-CM | POA: Insufficient documentation

## 2021-01-05 DIAGNOSIS — T148XXA Other injury of unspecified body region, initial encounter: Secondary | ICD-10-CM

## 2021-01-05 DIAGNOSIS — S8255XA Nondisplaced fracture of medial malleolus of left tibia, initial encounter for closed fracture: Secondary | ICD-10-CM | POA: Insufficient documentation

## 2021-01-05 DIAGNOSIS — Z8616 Personal history of COVID-19: Secondary | ICD-10-CM | POA: Insufficient documentation

## 2021-01-05 DIAGNOSIS — Z79899 Other long term (current) drug therapy: Secondary | ICD-10-CM | POA: Diagnosis not present

## 2021-01-05 DIAGNOSIS — S82832A Other fracture of upper and lower end of left fibula, initial encounter for closed fracture: Secondary | ICD-10-CM

## 2021-01-05 DIAGNOSIS — S8992XA Unspecified injury of left lower leg, initial encounter: Secondary | ICD-10-CM | POA: Diagnosis present

## 2021-01-05 DIAGNOSIS — S8252XA Displaced fracture of medial malleolus of left tibia, initial encounter for closed fracture: Secondary | ICD-10-CM

## 2021-01-05 MED ORDER — ACETAMINOPHEN 500 MG PO TABS
1000.0000 mg | ORAL_TABLET | Freq: Once | ORAL | Status: AC
  Administered 2021-01-05: 1000 mg via ORAL
  Filled 2021-01-05: qty 2

## 2021-01-05 MED ORDER — HYDROCODONE-ACETAMINOPHEN 5-325 MG PO TABS: 1.0000 | ORAL_TABLET | Freq: Three times a day (TID) | ORAL | 0 refills | Status: AC | PRN

## 2021-01-05 MED ORDER — OXYCODONE HCL 5 MG PO TABS
2.5000 mg | ORAL_TABLET | Freq: Once | ORAL | Status: AC
  Administered 2021-01-05: 2.5 mg via ORAL
  Filled 2021-01-05: qty 1

## 2021-01-05 NOTE — Progress Notes (Signed)
Orthopedic Tech Progress Note Patient Details:  [name] [name] June 13, 1950 [id number]  Ortho Devices Type of Ortho Device: Post (short leg) splint, Crutches Ortho Device/Splint Location: LLE Ortho Device/Splint Interventions: Ordered, Application, Adjustment   Spoke with provider about concern for proximal leg fracture after applying splint. PA said she would contact ortho for suggestion on treatment for knee injury. Post Interventions Patient Tolerated: Well Instructions Provided: Care of device  [name] [name] 01/05/2021, 9:09 AM

## 2021-01-05 NOTE — ED Notes (Signed)
Pt wheeled to her car via wheelchair. Pt verbalized understanding of D/C instructions.

## 2021-01-05 NOTE — ED Provider Notes (Signed)
Emergency Medicine Provider Triage Evaluation Note  [name] [name] , a 70 y.o. female  was evaluated in triage.  Pt complains of left knee and ankle pain.  She states that she fell twisted her leg.  Review of Systems  Positive: Leg pain, joint pain Negative: Fever, chills  Physical Exam  BP (!) 149/88 (BP Location: Right Arm)   Pulse 63   Temp 98.2 F (36.8 C) (Oral)   Resp 18   Ht 5\' 5"  (1.651 m)   Wt 106 kg   SpO2 96%   BMI 38.89 kg/m  Gen:   Awake, no distress   Resp:  Normal effort  MSK:   Moves extremities without difficulty  Other:    Medical Decision Making  Medically screening exam initiated at 3:13 AM.  Appropriate orders placed.  was informed that the remainder of the evaluation will be completed by another provider, this initial triage assessment does not replace that evaluation, and the importance of remaining in the ED until their evaluation is complete.  Joint pain   [name] Baltimore, PA-C 01/05/21 03/07/21    Palumbo, April, MD 01/05/21 [phone]

## 2021-01-05 NOTE — ED Provider Notes (Signed)
[facility]   CSN: [id number] Arrival date & time: 01/05/21  [id number]     History No chief complaint on file.   [name] [name] is a 70 y.o. female.  Who presents emergency department today after fall at home.  States that she has a ramp in her home that was her husband's.  She was walking down the ramp when she slipped and fell.  States that her left leg got caught under her and she fell on her bottom and lower back.  She thinks that the floor may have been wet.  She does not use a cane or a walker. Denies hitting her head or LOC.  Denies chest pain, palpitations, dizziness or lightheadedness, syncope prior to falling.  States that she attempted a warm compress and Tylenol prior to laying down however continued to have pain so presented to emergency department.  HPI     Past Medical History:  Diagnosis Date   Anemia    Arthritis    Blood in stool    GI consult 2012, EGD and colonoscopy   CAD (coronary artery disease)    Eczema    GERD (gastroesophageal reflux disease)    H/O bone density study 07/09/2006   [facility]; [facility])   Hemorrhoid    internal and external, general surgery consult 04/2011   Hyperlipidemia    Hypertension    Osteopenia     Patient Active Problem List   Diagnosis Date Noted   Precordial chest pain 12/10/2020   Cough 04/06/2020   Wheezy 04/06/2020   Bilateral otitis media 04/06/2020   Respiratory tract infection 04/06/2020   Atherosclerosis of aorta (HCC) 02/10/2020   Constipation 02/10/2020   Obesity (BMI 30-39.9) 02/10/2020   Educated about COVID-19 virus infection 11/12/2019   Coronary artery disease involving native coronary artery of native heart without angina pectoris 11/12/2019   Arthritis 01/28/2019   OAB (overactive bladder) 01/28/2019   Hx of adenomatous colonic polyps 12/13/2017   Essential hypertension, benign 10/16/2011   Hyperlipidemia 10/16/2011   Osteopenia 10/16/2011    Hemorrhoids 10/16/2011   GERD (gastroesophageal reflux disease) 10/16/2011    Past Surgical History:  Procedure Laterality Date   ABDOMINAL HYSTERECTOMY     COLONOSCOPY  2012, 2008   Dr. Loreta AveMann   ESOPHAGOGASTRODUODENOSCOPY  2012, 2008   Dr. Loreta AveMann     OB History   No obstetric history on file.     Family History  Problem Relation Age of Onset   Heart disease Mother        Pacemaker   Diabetes Mother    Cancer Father        lung   Diabetes Father    Diabetes Sister    Heart disease Sister    Hypertension Sister    Diabetes Brother    Heart disease Brother        1 brother died MI age 69 yo   Hypertension Brother    Stroke Brother    Cancer Other        maternal side,non first degree breast cancer   Hypertension Brother    Diabetes Brother    Heart disease Brother    Heart disease Brother    Hypertension Brother    Diabetes Brother    Hypertension Sister    Hypertension Sister    Diabetes Sister    Diabetes Sister    Hypertension Sister    Hypertension Sister    Diabetes Sister  Social History   Tobacco Use   Smoking status: Never   Smokeless tobacco: Never  Substance Use Topics   Alcohol use: No   Drug use: No    Home Medications Prior to Admission medications   Medication Sig Start Date End Date Taking? Authorizing Provider  albuterol (VENTOLIN HFA) 108 (90 Base) MCG/ACT inhaler Inhale 2 puffs into the lungs every 6 (six) hours as needed for wheezing or shortness of breath. 06/18/19   Tomma Lightning, MD  Ascorbic Acid (VITAMIN C) 100 MG CHEW Chew 1 tablet by mouth daily.    [provider]  aspirin 81 MG tablet Take 81 mg by mouth daily.    [provider]  atorvastatin (LIPITOR) 80 MG tablet TAKE 1 TABLET(80 MG) BY MOUTH DAILY 05/09/20   Rollene Rotunda, MD  azithromycin (ZITHROMAX) 250 MG tablet 2 tablets day 1, then 1 tablet days 2-4 Patient not taking: Reported on 12/12/2020 03/29/20   Tysinger, Kermit Balo, PA-C  bimatoprost  (LUMIGAN) 0.01 % SOLN Place 1 drop into the left eye at bedtime.    [provider]  bisoprolol-hydrochlorothiazide [facility]) 5-6.25 MG tablet TAKE 2 TABLETS BY MOUTH DAILY 01/04/21   Tysinger, Kermit Balo, PA-C  dexlansoprazole (DEXILANT) 60 MG capsule Take 1 capsule (60 mg total) by mouth daily. Patient not taking: Reported on 12/12/2020 01/28/19   Ronnald Nian, MD  felodipine (PLENDIL) 10 MG 24 hr tablet TAKE 1 TABLET(10 MG) BY MOUTH DAILY 06/01/20   Ronnald Nian, MD  FIBER PO Take 500 mg by mouth daily.    [provider]  fluticasone furoate-vilanterol (BREO ELLIPTA) 100-25 MCG/INH AEPB INHALE 1 PUFF INTO THE LUNGS DAILY 12/12/20   Olalere, Adewale A, MD  IRON PO Take 1,000 mg by mouth daily at 6 (six) AM.    [provider]  levofloxacin (LEVAQUIN) 500 MG tablet Take 1 tablet (500 mg total) by mouth daily. Patient not taking: Reported on 12/12/2020 04/06/20   Tysinger, Kermit Balo, PA-C  linaclotide [facility]) 145 MCG CAPS capsule Take 145 mcg by mouth daily before breakfast.    [provider]  mirabegron ER (MYRBETRIQ) 50 MG TB24 tablet Myrbetriq 50 mg tablet,extended release  TK 1 T PO QD Patient not taking: Reported on 12/12/2020    [provider]  Multiple Vitamin (MULTIVITAMIN) capsule Take 1 capsule by mouth daily.      [provider]  Omega-3 Fatty Acids (FISH OIL PO) Take 2 capsules by mouth daily.    [provider]  omeprazole (PRILOSEC) 20 MG capsule Take 1 capsule (20 mg total) by mouth daily. 11/14/20   Gerhard Munch, MD  predniSONE (DELTASONE) 10 MG tablet 6/5/4/3/2/1 taper Patient not taking: Reported on 12/12/2020 04/06/20   Tysinger, Kermit Balo, PA-C  sucralfate (CARAFATE) 1 g tablet Take 1 tablet (1 g total) by mouth 4 (four) times daily -  with meals and at bedtime. Patient taking differently: Take 1 g by mouth 4 (four) times daily -  with meals and at bedtime. Patient takes as needed 11/14/20   Gerhard Munch, MD  vitamin E  400 UNIT capsule Take 400 Units by mouth daily.    [provider]  XIIDRA 5 % SOLN Apply 1 drop to eye 2 (two) times daily. 06/23/20   [provider]    Allergies    Crestor [rosuvastatin] and Penicillins  Review of Systems   Review of Systems  Musculoskeletal:  Positive for joint swelling.  Skin:  Negative for  wound.  Neurological:  Negative for weakness and numbness.  All other systems reviewed and are negative.  Physical Exam Updated Vital Signs BP (!) 146/85 (BP Location: Right Arm)   Pulse (!) 58   Temp 98.8 F (37.1 C) (Oral)   Resp (!) 22   Ht 5\' 5"  (1.651 m)   Wt 106 kg   SpO2 97%   BMI 38.89 kg/m   Physical Exam Vitals and nursing note reviewed.  Constitutional:      Appearance: Normal appearance.  HENT:     Head: Normocephalic and atraumatic.  Eyes:     Extraocular Movements: Extraocular movements intact.     Pupils: Pupils are equal, round, and reactive to light.  Musculoskeletal:        General: Swelling, tenderness and signs of injury present.     Cervical back: Normal range of motion and neck supple. No tenderness.     Left lower leg: Tenderness present.     Left ankle: Swelling present. Tenderness present. Decreased range of motion.  Skin:    General: Skin is warm and dry.     Capillary Refill: Capillary refill takes less than 2 seconds.     Findings: No bruising.  Neurological:     General: No focal deficit present.     Mental Status: She is alert and oriented to person, place, and time.     Sensory: No sensory deficit.   ED Results / Procedures / Treatments   Labs (all labs ordered are listed, but only abnormal results are displayed) Labs Reviewed - No data to display  EKG None  Radiology DG Ankle 2 Views Left  Result Date: 01/05/2021 CLINICAL DATA:  Ankle fracture.  Stress views EXAM: LEFT ANKLE - 2 VIEW COMPARISON:  Radiography from earlier today FINDINGS: Normal alignment at the ankle. Medial malleolus fracture at the  tip which is nondisplaced. Dorsal talar neck irregularity is not evaluated again given lack of lateral view. Soft tissue swelling IMPRESSION: 1. No new finding when compared to prior.  Normal ankle alignment. 2. Nondisplaced medial malleolus fracture. Electronically Signed   By: 03/07/2021 M.D.   On: 01/05/2021 07:10   DG Ankle Complete Left  Result Date: 01/05/2021 CLINICAL DATA:  Ankle pain after fall last night EXAM: LEFT ANKLE COMPLETE - 3+ VIEW COMPARISON:  None. FINDINGS: Generalized soft tissue swelling. Equivocal for dorsal talar neck avulsion fracture (versus spur). Medial malleolus avulsion fracture without displacement. No malalignment, including medial clear space widening. IMPRESSION: 1. Dorsal talar neck avulsion fracture versus spur. 2. Medial malleolus avulsion fracture. 3. Given the fibular neck fracture, consider stress views of the ankle. Electronically Signed   By: 03/07/2021 M.D.   On: 01/05/2021 04:10   DG Knee Complete 4 Views Left  Result Date: 01/05/2021 CLINICAL DATA:  Fall last night with left knee pain EXAM: LEFT KNEE - COMPLETE 4+ VIEW COMPARISON:  None. FINDINGS: Branching fibular neck fracture with to 2 mm of cortical displacement. Normal knee alignment. Negative for joint effusion. IMPRESSION: Fibular neck fracture. Electronically Signed   By: 03/07/2021 M.D.   On: 01/05/2021 04:04    Procedures Procedures   Medications Ordered in ED Medications  acetaminophen (TYLENOL) tablet 1,000 mg (1,000 mg Oral Given 01/05/21 0634)  oxyCODONE (Oxy IR/ROXICODONE) immediate release tablet 2.5 mg (2.5 mg Oral Given 01/05/21 03/07/21)    ED Course  I have reviewed the triage vital signs and the nursing notes.  Pertinent labs & imaging results that were available  during my care of the patient were reviewed by me and considered in my medical decision making (see chart for details).   MDM Rules/Calculators/A&P 70 year old female presents emergency department after  fall. Patient was found to have a medial malleolus avulsion fracture, and fibular neck fracture with 2 mm of cortical displacement.  Obtained stress ankle series which showed no change from previous imaging. Patient is otherwise well-appearing, hemodynamically stable and shows no evidence of neurovascular injury or compartment syndrome.  At time of handoff patient continues to be hemodynamically stable.  She will be casted, provided with crutches, ambulated with crutches prior to discharge.  Provided referral to Dr. Susa Simmonds with [facility].  She states she has family nearby including her son who lives behind her, and sister who is nearby who to help her ambulate around the house if she needs to.  She is agreeable to the plan. Final Clinical Impression(s) / ED Diagnoses Final diagnoses:  Closed fracture of proximal end of left fibula, unspecified fracture morphology, initial encounter  Closed avulsion fracture of medial malleolus of left tibia, initial encounter   Rx / DC Orders ED Discharge Orders          Ordered    HYDROcodone-acetaminophen (NORCO/VICODIN) 5-325 MG tablet  Every 8 hours PRN        01/05/21 0728             Cristopher Peru, PA-C 01/05/21 0736    Nira Conn, MD 01/05/21 [phone]

## 2021-01-05 NOTE — Discharge Instructions (Addendum)
You were seen in the emergency department today after a fall at home.  We took a picture of your left knee and ankle.  The x-ray showed that you had a fracture of your fibula. It also showed that you had an avulsion fracture of your medial malleolus (inside ankle bone). I have attached some instructions for you to follow with your discharge paper work.   You will be non-weight bearing after you are placed in your cast. We have provided you with crutches to help facilitate you getting around your home. You also confirmed you have family help nearby to help you with mobility and transportation if needed.   You will follow-up with orthopedics Dr. Susa Simmonds. I have provided you with the information for their office to call and make an appointment.   For pain control you may take at 1000 mg of Tylenol every 8 hours scheduled.  In addition you can take 0.5 to 1 tablet of Norco/Vicodin every 8 hours for pain not controlled with the scheduled Tylenol.

## 2021-01-05 NOTE — ED Triage Notes (Signed)
Mechanical fall and now having left foot pain and left calf pain.

## 2021-01-05 NOTE — ED Provider Notes (Signed)
70 year old female with left leg pain after a fall, see prior note for complete H&P. + fracture.  Physical Exam  BP (!) 146/85 (BP Location: Right Arm)   Pulse (!) 58   Temp 98.8 F (37.1 C) (Oral)   Resp (!) 22   Ht 5\' 5"  (1.651 m)   Wt 106 kg   SpO2 97%   BMI 38.89 kg/m   Physical Exam  ED Course/Procedures     Procedures  MDM  SPLINT APPLICATION Date/Time: 8:30 AM Authorized by: Consent: Verbal consent obtained. Risks and benefits: risks, benefits and alternatives were discussed Consent given by: patient Splint applied by: orthopedic technician Location details: posterior short leg with stirrup  Splint type: fiberglass OCL Supplies used: ace, stockinet, fiberglass OCL Post-procedure: The splinted body part was neurovascularly unchanged following the procedure. Patient tolerance: Patient tolerated the procedure well with no immediate complications.  Splint does not extend up to proximal fibula. Patient is concerned about her pain in her knee. Discussed with Jeannie Fend, PA-C with ortho. Does not need knee immobilizer or splint extended more proximally, could even be in a walking boot to WBAT.         Charma Igo, PA-C 01/05/21 03/07/21    [id number], MD 01/05/21 [phone]

## 2021-01-12 ENCOUNTER — Ambulatory Visit: Payer: Medicare PPO | Admitting: Pulmonary Disease

## 2021-01-16 ENCOUNTER — Other Ambulatory Visit: Payer: Self-pay

## 2021-01-16 ENCOUNTER — Ambulatory Visit: Payer: Medicare PPO | Admitting: Pulmonary Disease

## 2021-01-16 ENCOUNTER — Encounter: Payer: Self-pay | Admitting: Pulmonary Disease

## 2021-01-16 VITALS — BP 142/84 | HR 64 | Temp 98.4°F | Ht 65.0 in | Wt 230.8 lb

## 2021-01-16 DIAGNOSIS — R0602 Shortness of breath: Secondary | ICD-10-CM

## 2021-01-16 NOTE — Patient Instructions (Signed)
Continue with Breo as needed  You can try to use it more liberally  Call with significant concerns  Follow-up in 6 months

## 2021-01-16 NOTE — Progress Notes (Signed)
Subjective:    Patient ID: [name] [name], female    DOB: 07-08-1950, 70 y.o.   MRN: [id number]  Patient with complaints of fatigue, shortness of breath on exertion  Breathing is better  Breathing remains about the same  Uses Breo about once a week  She did have to use it last night because she woke up to use the restroom and had some wheezing  Denies any chest pains or chest discomfort Breathing feels about the same Work-up has been relatively benign with diastolic dysfunction on echocardiogram, PFT with reduced TLC  Wheezing is better with Breo  Previously She had similar symptoms about a year ago following obtaining a flu shot .  She stated she required 3 rounds of antibiotics and steroids before symptoms finally settled down about a year ago   Recently about September she saw PCP for routine follow-up, she stated she was encouraged to get a flu shot on following obtaining a flu shot she started feeling poorly Bronchitis  .  Steroids and albuterol did help symptoms  .  Symptoms started about October with shortness of breath and fatigue .  Followed up with her primary doctor .  December 22 she ended up in the emergency department where she was diagnosed with a viral URI, Covid negative  .  Has had persistent symptoms since then  .  She does have wheezing, shortness of breath, significant fatigue  .  She does have a history of reflux  .  Recently had a cardiac work-up done she was told that her heart was doing okay.  .  Never smoker .  Worked in Neurosurgeon    Past Medical History:  Diagnosis Date   Anemia    Arthritis    Blood in stool    GI consult 2012, EGD and colonoscopy   CAD (coronary artery disease)    Eczema    GERD (gastroesophageal reflux disease)    H/O bone density study 07/09/2006   [facility]openia; [facility]   Hemorrhoid    internal and external, general surgery consult 04/2011   Hyperlipidemia    Hypertension    Osteopenia    Social  History   Socioeconomic History   Marital status: Widowed    Spouse name: Not on file   Number of children: Not on file   Years of education: Not on file   Highest education level: Not on file  Occupational History   Occupation: [employer]    Comment: Retired   Tobacco Use   Smoking status: Never   Smokeless tobacco: Never  Substance and Sexual Activity   Alcohol use: No   Drug use: No   Sexual activity: Not Currently  Other Topics Concern   Not on file  Social History Narrative   Widow.  Retired from [employer].   Three grand.     Social Determinants of Health   Financial Resource Strain: Not on file  Food Insecurity: Not on file  Transportation Needs: Not on file  Physical Activity: Not on file  Stress: Not on file  Social Connections: Not on file  Intimate Partner Violence: Not on file   Family History  Problem Relation Age of Onset   Heart disease Mother        Pacemaker   Diabetes Mother    Cancer Father        lung   Diabetes Father    Diabetes Sister    Heart disease Sister    Hypertension Sister  Diabetes Brother    Heart disease Brother        1 brother died MI age 28 yo   Hypertension Brother    Stroke Brother    Cancer Other        maternal side,non first degree breast cancer   Hypertension Brother    Diabetes Brother    Heart disease Brother    Heart disease Brother    Hypertension Brother    Diabetes Brother    Hypertension Sister    Hypertension Sister    Diabetes Sister    Diabetes Sister    Hypertension Sister    Hypertension Sister    Diabetes Sister    Review of Systems  Constitutional:  Positive for fatigue.  Respiratory:  Positive for shortness of breath and wheezing.   Cardiovascular: Negative.   Gastrointestinal: Negative.   Genitourinary: Negative.   Musculoskeletal:  Negative for arthralgias.     Objective:   Physical Exam Constitutional:      Appearance: She is obese.  HENT:     Head: Normocephalic and atraumatic.   Cardiovascular:     Rate and Rhythm: Normal rate and regular rhythm.     Pulses: Normal pulses.     Heart sounds: Normal heart sounds. No murmur heard.   No friction rub.  Pulmonary:     Effort: Pulmonary effort is normal. No respiratory distress.     Breath sounds: Normal breath sounds. No stridor. No wheezing or rhonchi.  Musculoskeletal:        General: No swelling.     Cervical back: No rigidity or tenderness.  Neurological:     Mental Status: She is alert.  Psychiatric:        Mood and Affect: Mood normal.   Vitals:   01/16/21 0941  BP: (!) 142/84  Pulse: 64  Temp: 98.4 F (36.9 C)  SpO2: 98%   Myocardial perfusion scan 05/28/2019-no evidence of ischemia Chest x-ray 04/28/2019-no acute infiltrate-reviewed by myself  Thyroid function test within normal limits  PFT significant for reduced total lung capacity-restrictive picture, normal DLCO  Echocardiogram significant for diastolic dysfunction     Assessment & Plan:  .  Persistent shortness of breath and fatigue -Mild improvement in symptoms with use of Breo -Only using Breo intermittently  .  Denies a history of underlying lung disease  .  Continue Breo -May use that as needed  Graded exercise as tolerated  .  Follow-up in about 6 months   .  Encouraged to call with any significant concerns .  Continue with graded exercises  Encouraged to call with any significant concerns Will consider repeating PFTs, echocardiogram if symptoms do not remain controlled

## 2021-02-10 ENCOUNTER — Ambulatory Visit: Payer: Medicare PPO | Admitting: Family Medicine

## 2021-02-14 ENCOUNTER — Ambulatory Visit: Payer: Medicare PPO | Admitting: Family Medicine

## 2021-04-11 ENCOUNTER — Other Ambulatory Visit: Payer: Self-pay | Admitting: Student

## 2021-04-11 ENCOUNTER — Ambulatory Visit
Admission: RE | Admit: 2021-04-11 | Discharge: 2021-04-11 | Disposition: A | Discharge status: Home / Self Care | Payer: Medicare PPO | Source: Ambulatory Visit | Attending: Student | Admitting: Student

## 2021-04-11 DIAGNOSIS — M545 Low back pain, unspecified: Secondary | ICD-10-CM

## 2021-04-12 ENCOUNTER — Other Ambulatory Visit: Payer: Self-pay | Admitting: Medical

## 2021-04-12 DIAGNOSIS — I10 Essential (primary) hypertension: Secondary | ICD-10-CM

## 2021-05-30 LAB — COLOGUARD: COLOGUARD: NEGATIVE

## 2021-05-31 ENCOUNTER — Other Ambulatory Visit: Payer: Self-pay | Admitting: Obstetrics and Gynecology

## 2021-05-31 DIAGNOSIS — R221 Localized swelling, mass and lump, neck: Secondary | ICD-10-CM

## 2021-06-05 ENCOUNTER — Ambulatory Visit
Admission: RE | Admit: 2021-06-05 | Discharge: 2021-06-05 | Disposition: A | Discharge status: Home / Self Care | Payer: Medicare PPO | Source: Ambulatory Visit | Attending: Obstetrics and Gynecology | Admitting: Obstetrics and Gynecology

## 2021-06-05 DIAGNOSIS — R221 Localized swelling, mass and lump, neck: Secondary | ICD-10-CM

## 2021-06-11 ENCOUNTER — Other Ambulatory Visit: Payer: Self-pay | Admitting: Family Medicine

## 2021-06-11 DIAGNOSIS — I10 Essential (primary) hypertension: Secondary | ICD-10-CM

## 2021-06-16 ENCOUNTER — Other Ambulatory Visit: Payer: Self-pay | Admitting: Cardiology

## 2021-06-16 DIAGNOSIS — E785 Hyperlipidemia, unspecified: Secondary | ICD-10-CM

## 2022-01-09 NOTE — Progress Notes (Signed)
Cardiology Office Note   Date:  01/11/2022   ID:  Joli, Koob 08-Nov-1950, MRN [id number]  PCP:  Hillery Aldo, NP  Cardiologist:   None    Chief Complaint  Patient presents with   Shortness of Breath     History of Present Illness: [name] [name] is a 71 y.o. female who presented for evaluation of shortness of breath.  She had a normal echo in 2006.  She had a cardiac catheterization in 2006 with 40-50% proximal LAD stenosis and mild plaquing elsewhere I saw her in 2015 for evaluation of SOB.  She had a perfusion study in 2015 that was normal.  Echo in March demonstrated an EF of 60%.  She also had a perfusion study in January 2021.  There was no ischemia on this.  She had pulmonary function testing in April with no significant findings that she has seen pulmonary.    She was in the ED with chest pain in 2022.  Since I last saw her she has seen pulmonary.  Her breathing is at baseline.  She gets short of breath with activities such as working briskly with household chores.  She does go to the gym occasionally.  She has had no new shortness of breath, PND or orthopnea.  She had no new palpitations, presyncope or syncope.  She has had no chest pressure, neck or arm discomfort.  She is trying to lose a little weight and is starting on some diet.   Past Medical History:  Diagnosis Date   Anemia    Arthritis    Blood in stool    GI consult 2012, EGD and colonoscopy   CAD (coronary artery disease)    Eczema    GERD (gastroesophageal reflux disease)    H/O bone density study 07/09/2006   [facility]; [facility]   Hemorrhoid    internal and external, general surgery consult 04/2011   Hyperlipidemia    Hypertension    Osteopenia     Past Surgical History:  Procedure Laterality Date   ABDOMINAL HYSTERECTOMY     COLONOSCOPY  2012, 2008   Dr. Loreta Ave   ESOPHAGOGASTRODUODENOSCOPY  2012, 2008   Dr. Loreta Ave     Current Outpatient Medications  Medication Sig Dispense Refill    acetaminophen (TYLENOL) 650 MG CR tablet Take 1,300 mg by mouth every 8 (eight) hours as needed for pain.     albuterol (VENTOLIN HFA) 108 (90 Base) MCG/ACT inhaler Inhale 2 puffs into the lungs every 6 (six) hours as needed for wheezing or shortness of breath. 18 g 11   Ascorbic Acid (VITAMIN C) 100 MG CHEW Chew 100 mg by mouth daily.     aspirin 81 MG tablet Take 81 mg by mouth daily.     atorvastatin (LIPITOR) 80 MG tablet TAKE 1 TABLET(80 MG) BY MOUTH DAILY 90 tablet 3   bimatoprost (LUMIGAN) 0.01 % SOLN Place 1 drop into the left eye at bedtime.     bisoprolol-hydrochlorothiazide (ZIAC) 5-6.25 MG tablet TAKE 2 TABLETS BY MOUTH DAILY 180 tablet 0   felodipine (PLENDIL) 10 MG 24 hr tablet TAKE 1 TABLET(10 MG) BY MOUTH DAILY (Patient taking differently: Take 10 mg by mouth daily.) 90 tablet 3   FIBER PO Take 500 mg by mouth daily.     fluticasone furoate-vilanterol (BREO ELLIPTA) 100-25 MCG/INH AEPB INHALE 1 PUFF INTO THE LUNGS DAILY (Patient taking differently: Inhale 1 puff into the lungs at bedtime.) 60 each 5  IRON PO Take 1,000 mg by mouth daily at 6 (six) AM.     linaclotide (LINZESS) 145 MCG CAPS capsule Take 145 mcg by mouth daily before breakfast.     Multiple Vitamin (MULTIVITAMIN) capsule Take 1 capsule by mouth daily.       Omega-3 Fatty Acids (FISH OIL PO) Take 2 capsules by mouth daily.     omeprazole (PRILOSEC) 40 MG capsule Take 40 mg by mouth daily.     solifenacin (VESICARE) 5 MG tablet Take 5 mg by mouth daily.     vitamin E 400 UNIT capsule Take 400 Units by mouth daily.     XIIDRA 5 % SOLN Place 1 drop into both eyes 2 (two) times daily.     No current facility-administered medications for this visit.    Allergies:   Crestor [rosuvastatin] and Penicillins    ROS:  Please see the history of present illness.   Otherwise, review of systems are positive for none.   All other systems are reviewed and negative.    PHYSICAL EXAM: VS:  BP 130/74   Pulse (!) 57   Ht  5\' 5"  (1.651 m)   Wt 230 lb 12.8 oz (104.7 kg)   SpO2 91%   BMI 38.41 kg/m  , BMI Body mass index is 38.41 kg/m. GENERAL:  Well appearing NECK:  No jugular venous distention, waveform within normal limits, carotid upstroke brisk and symmetric, no bruits, no thyromegaly LUNGS:  Clear to auscultation bilaterally CHEST:  Unremarkable HEART:  PMI not displaced or sustained,S1 and S2 within normal limits, no S3, no S4, no clicks, no rubs, no murmurs ABD:  Flat, positive bowel sounds normal in frequency in pitch, no bruits, no rebound, no guarding, no midline pulsatile mass, no hepatomegaly, no splenomegaly EXT:  2 plus pulses throughout, no edema, no cyanosis no clubbing  EKG:  EKG is  ordered today. The ekg ordered today demonstrates sinus rhythm, rate 57, axis within normal limits, intervals within normal limits, no acute ST-T wave changes.   Recent Labs: No results found for requested labs within last 365 days.    Lipid Panel    Component Value Date/Time   CHOL 179 11/13/2019 1138   TRIG 158 (H) 11/13/2019 1138   HDL 51 11/13/2019 1138   CHOLHDL 3.5 11/13/2019 1138   CHOLHDL 2.8 11/09/2016 0850   VLDL 29 11/09/2016 0850   LDLCALC 100 (H) 11/13/2019 1138      Wt Readings from Last 3 Encounters:  01/11/22 230 lb 12.8 oz (104.7 kg)  01/16/21 230 lb 12.8 oz (104.7 kg)  01/05/21 233 lb 11 oz (106 kg)      Other studies Reviewed: Additional studies/ records that were reviewed today include: none  Review of the above records demonstrates:  NA   ASSESSMENT AND PLAN:  SOB:   This is unchanged from the time of her perfusion study and probably multifactorial.  There is no reason to think that she has had new obstructive coronary disease.  We talked about risk reduction.  No further testing.   CAD:   We talked about risk reduction and including diet and exercise.  HTN:   At target.  No change in therapy.  DYSLIPIDEMIA:    I asked her to get her lipids drawn by her primary  provider when she has an upcoming appointment.  I be happy to review these and suggest changes with a goal LDL less than 100 and ideally in the 70s.   Current medicines  are reviewed at length with the patient today.  The patient does not have concerns regarding medicines.  The following changes have been made: None  Labs/ tests ordered today include: None  Orders Placed This Encounter  Procedures   EKG 12-Lead    Disposition:   FU with me as needed   Signed, Minus Breeding, MD  01/11/2022 10:57 AM    [place]

## 2022-01-11 ENCOUNTER — Encounter: Payer: Self-pay | Admitting: Cardiology

## 2022-01-11 ENCOUNTER — Ambulatory Visit: Payer: Medicare PPO | Attending: Cardiology | Admitting: Cardiology

## 2022-01-11 VITALS — BP 130/74 | HR 57 | Ht 65.0 in | Wt 230.8 lb

## 2022-01-11 DIAGNOSIS — I2510 Atherosclerotic heart disease of native coronary artery without angina pectoris: Secondary | ICD-10-CM

## 2022-01-11 DIAGNOSIS — E785 Hyperlipidemia, unspecified: Secondary | ICD-10-CM | POA: Diagnosis not present

## 2022-01-11 DIAGNOSIS — I10 Essential (primary) hypertension: Secondary | ICD-10-CM | POA: Diagnosis not present

## 2022-01-11 DIAGNOSIS — R072 Precordial pain: Secondary | ICD-10-CM

## 2022-01-11 NOTE — Patient Instructions (Signed)
Medication Instructions:  The current medical regimen is effective;  continue present plan and medications.  *If you need a refill on your cardiac medications before your next appointment, please call your pharmacy*   Follow-Up: At [facility], you and your health needs are our priority.  As part of our continuing mission to provide you with exceptional heart care, we have created designated Provider Care Teams.  These Care Teams include your primary Cardiologist (physician) and Advanced Practice Providers (APPs -  Physician Assistants and Nurse Practitioners) who all work together to provide you with the care you need, when you need it.  We recommend signing up for the patient portal called "MyChart".  Sign up information is provided on this After Visit Summary.  MyChart is used to connect with patients for Virtual Visits (Telemedicine).  Patients are able to view lab/test results, encounter notes, upcoming appointments, etc.  Non-urgent messages can be sent to your provider as well.   To learn more about what you can do with MyChart, go to [email or web address].    Your next appointment:   As needed  The format for your next appointment:   In Person  Provider:   James Hochrein, MD         

## 2022-02-06 IMAGING — DX DG LUMBAR SPINE COMPLETE 4+V
4 series · 4 of 4 positions shown · non-contrast
Comparison: No recent prior.

CLINICAL DATA: Low back pain.

EXAM:
LUMBAR SPINE - COMPLETE 4+ VIEW

[view not recorded (1 of 4)]
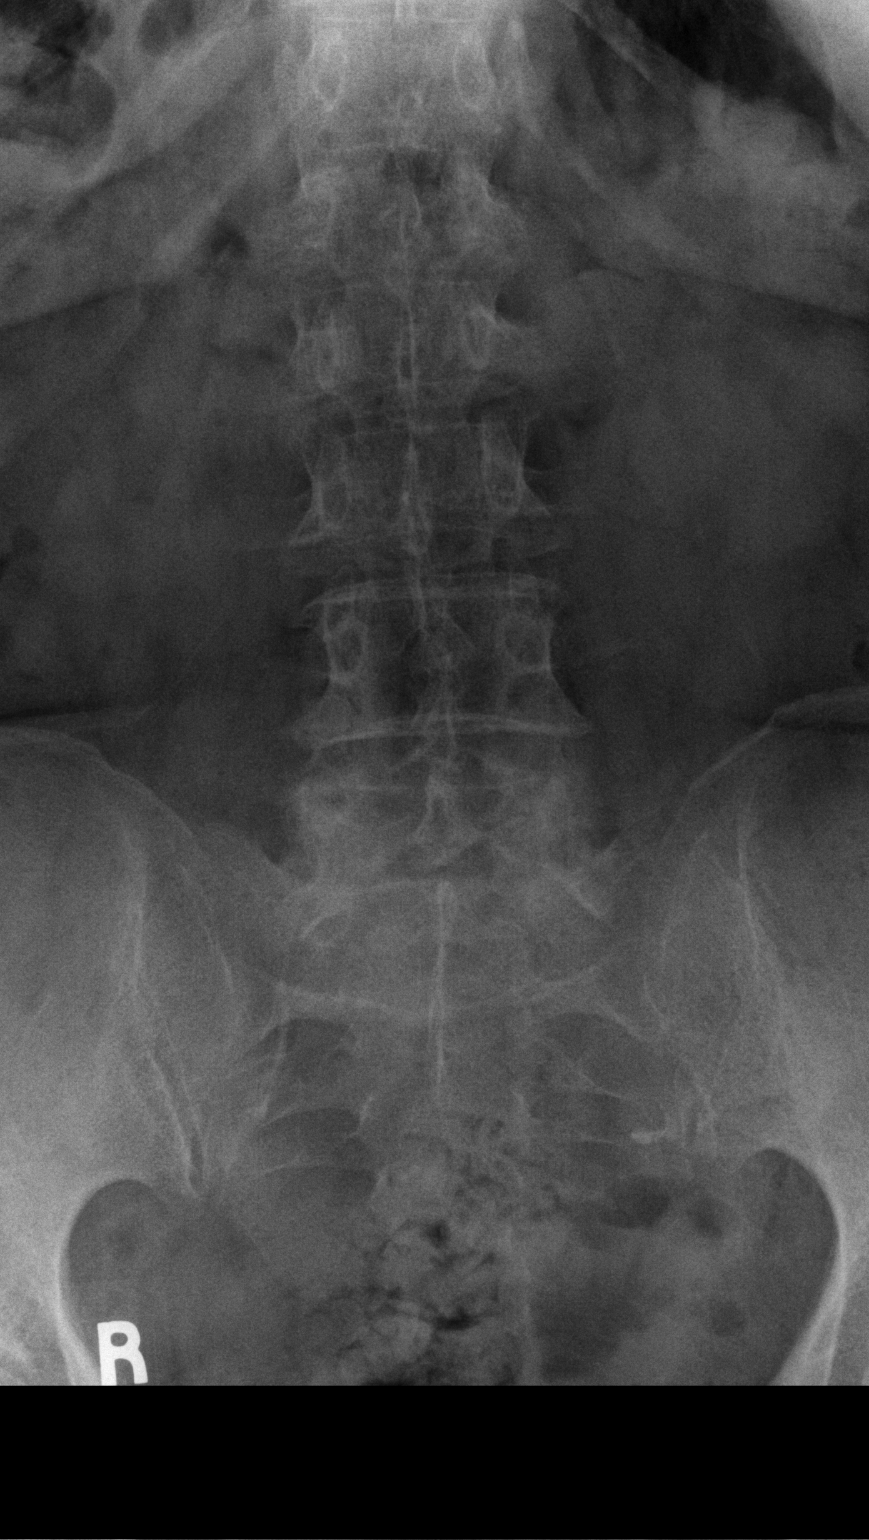

[view not recorded (2 of 4)]
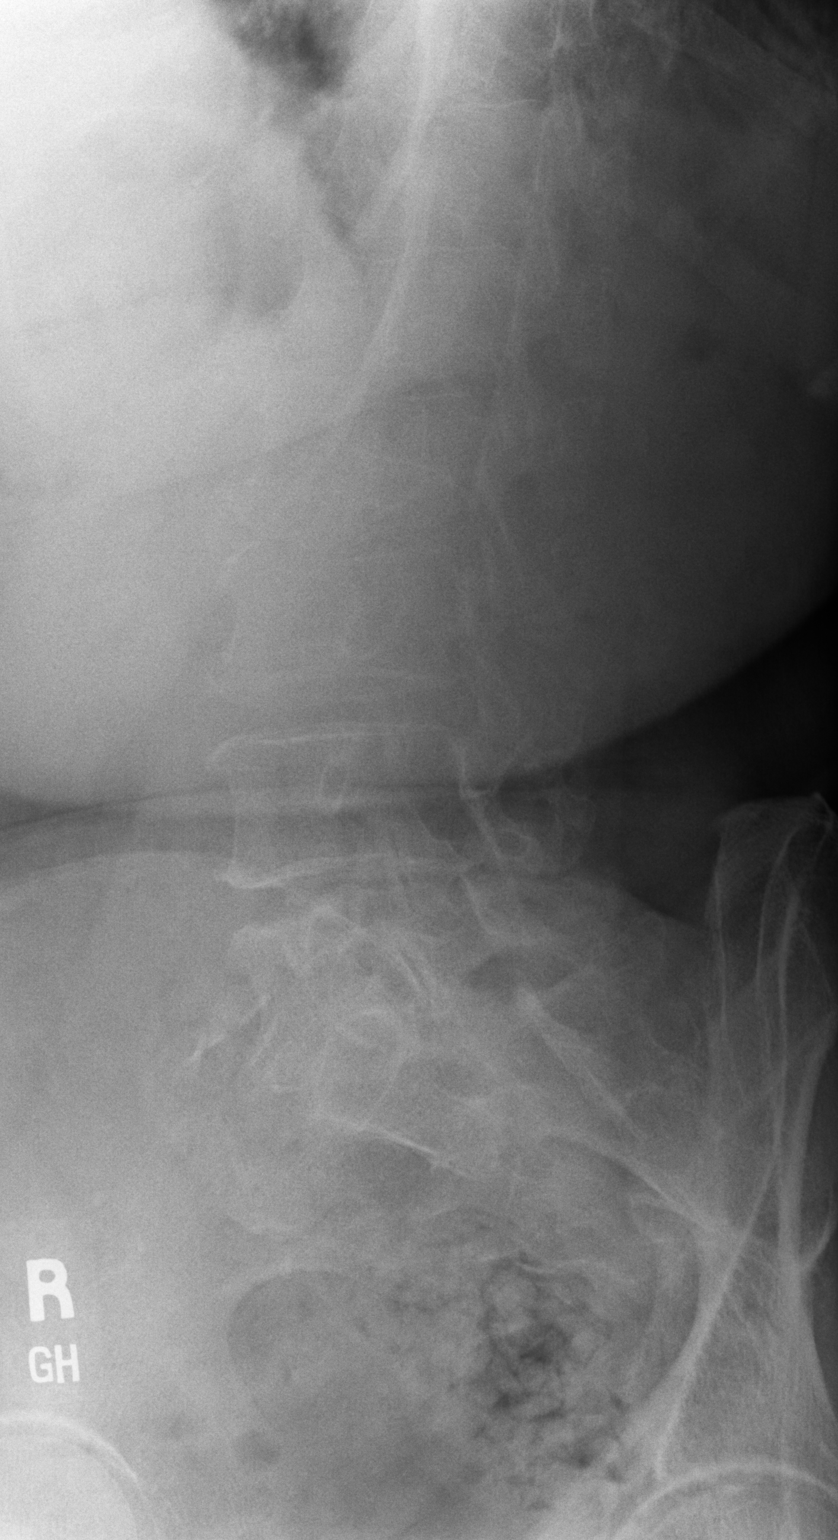

[view not recorded (3 of 4)]
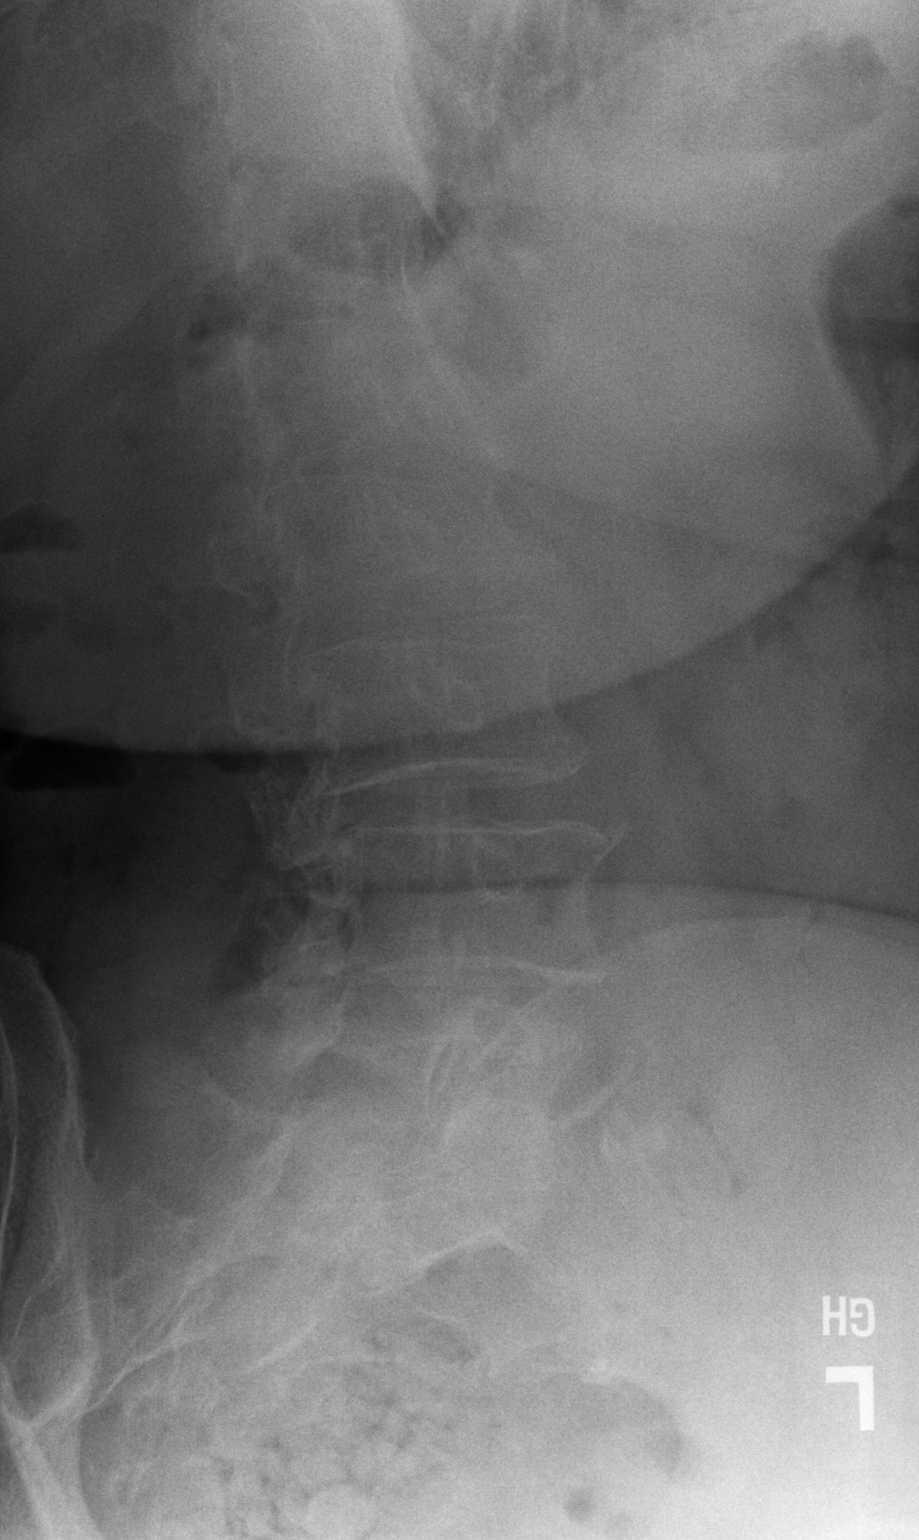

[view not recorded (4 of 4)]
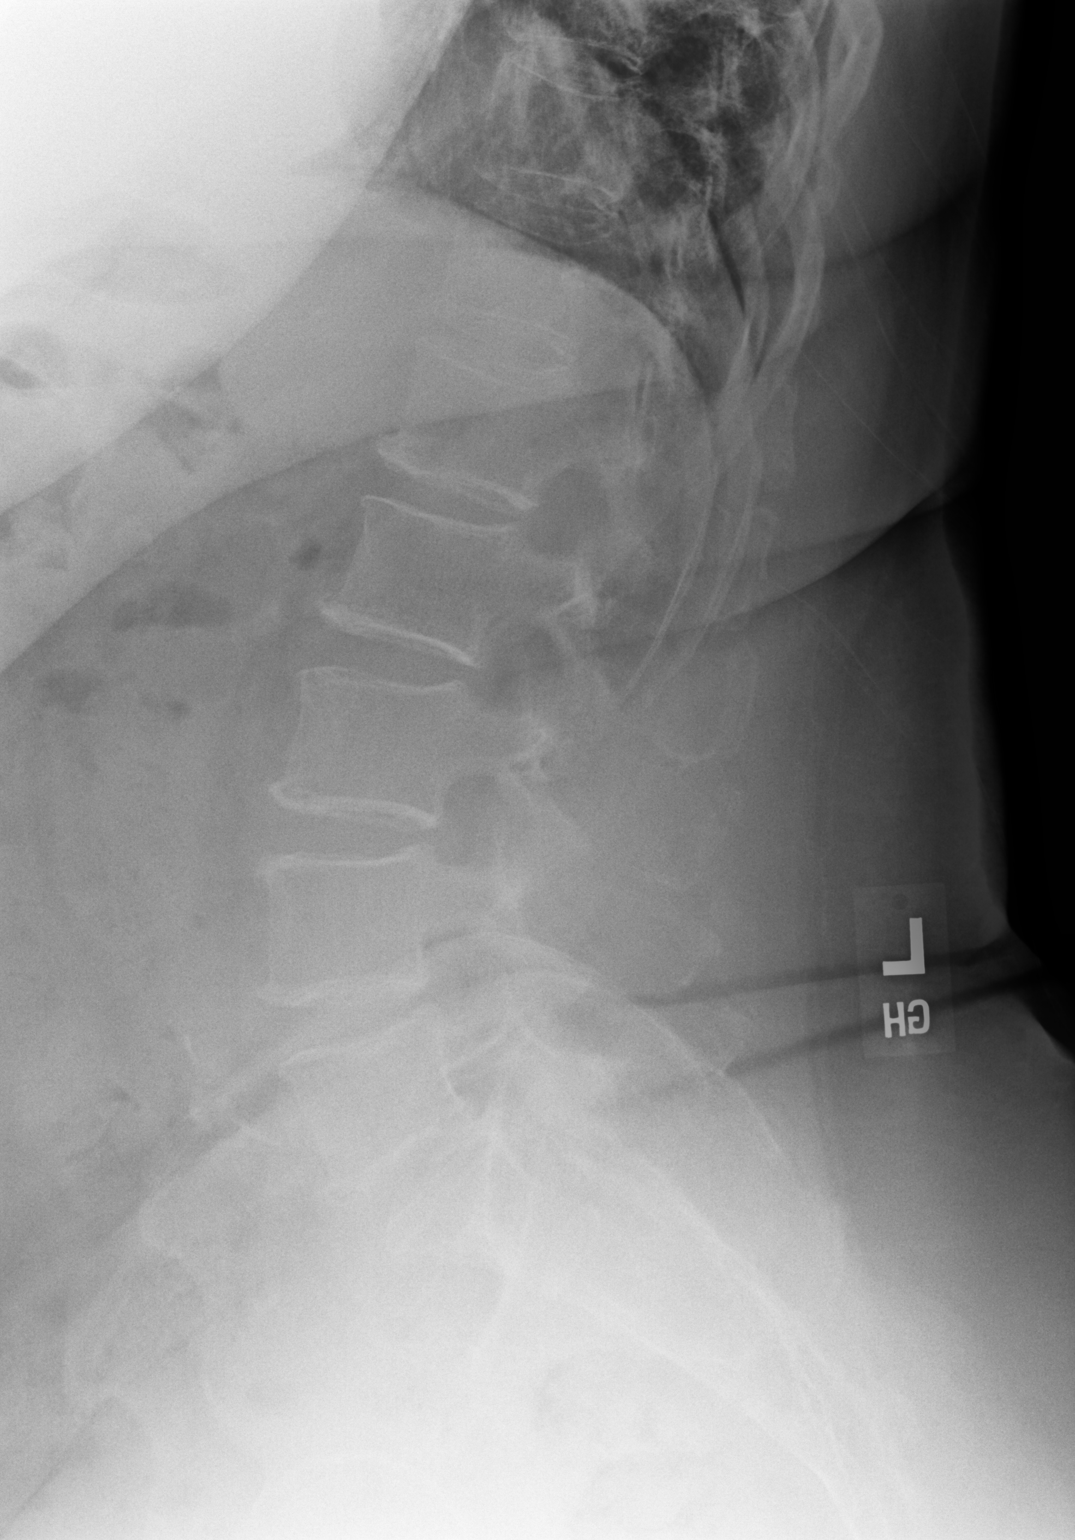

[4 of 4 positions shown; findings below may reference images not displayed]

FINDINGS: Diffuse osteopenia. Multilevel degenerative changes with multilevel
disc degeneration. No acute bony abnormality identified. Aortoiliac
atherosclerotic vascular calcification. Large amount of stool noted
in the colon.
IMPRESSION: 1.  Multilevel degenerative change.  No acute bony abnormality.

2.  Aortoiliac atherosclerotic vascular disease.

3.  Large amount of stool noted in the colon.

## 2022-04-25 ENCOUNTER — Other Ambulatory Visit: Payer: Self-pay | Admitting: Orthopedic Surgery

## 2022-04-25 DIAGNOSIS — M542 Cervicalgia: Secondary | ICD-10-CM

## 2022-04-25 DIAGNOSIS — M545 Low back pain, unspecified: Secondary | ICD-10-CM

## 2022-05-09 ENCOUNTER — Other Ambulatory Visit: Payer: Self-pay | Admitting: Orthopedic Surgery

## 2022-05-09 DIAGNOSIS — M545 Low back pain, unspecified: Secondary | ICD-10-CM

## 2022-05-09 DIAGNOSIS — M542 Cervicalgia: Secondary | ICD-10-CM

## 2022-05-19 ENCOUNTER — Other Ambulatory Visit: Payer: Medicare PPO

## 2022-05-20 ENCOUNTER — Ambulatory Visit
Admission: RE | Admit: 2022-05-20 | Discharge: 2022-05-20 | Disposition: A | Discharge status: Home / Self Care | Payer: Medicare PPO | Source: Ambulatory Visit | Attending: Orthopedic Surgery | Admitting: Orthopedic Surgery

## 2022-05-20 DIAGNOSIS — M545 Low back pain, unspecified: Secondary | ICD-10-CM

## 2022-05-20 DIAGNOSIS — M542 Cervicalgia: Secondary | ICD-10-CM

## 2022-05-21 ENCOUNTER — Other Ambulatory Visit: Payer: Self-pay | Admitting: Cardiology

## 2022-05-21 DIAGNOSIS — E785 Hyperlipidemia, unspecified: Secondary | ICD-10-CM

## 2022-07-11 ENCOUNTER — Other Ambulatory Visit: Payer: Self-pay | Admitting: Student

## 2022-07-11 DIAGNOSIS — R202 Paresthesia of skin: Secondary | ICD-10-CM

## 2022-07-11 DIAGNOSIS — R0989 Other specified symptoms and signs involving the circulatory and respiratory systems: Secondary | ICD-10-CM

## 2022-07-26 ENCOUNTER — Ambulatory Visit
Admission: RE | Admit: 2022-07-26 | Discharge: 2022-07-26 | Disposition: A | Discharge status: Home / Self Care | Payer: Medicare PPO | Source: Ambulatory Visit | Attending: Student | Admitting: Student

## 2022-07-26 DIAGNOSIS — R0989 Other specified symptoms and signs involving the circulatory and respiratory systems: Secondary | ICD-10-CM

## 2022-07-26 DIAGNOSIS — R202 Paresthesia of skin: Secondary | ICD-10-CM

## 2022-09-14 ENCOUNTER — Other Ambulatory Visit: Payer: Self-pay | Admitting: Pulmonary Disease

## 2023-03-17 NOTE — Progress Notes (Unsigned)
Cardiology Office Note:   Date:  03/20/2023  ID:  [name] [name], DOB 12-06-1950, MRN [id number] PCP: [name] Aldo, NP  [facility] Providers Cardiologist:  None {  History of Present Illness:   [name] [name] is a 72 y.o. female who presented for evaluation of shortness of breath.  She had a normal echo in 2006.  She had a cardiac catheterization in 2006 with 40-50% proximal LAD stenosis and mild plaquing elsewhere I saw her in 2015 for evaluation of SOB.  She had a perfusion study in 2015 that was normal.  Echo in March demonstrated an EF of 60%.  She also had a perfusion study in January 2021.  There was no ischemia on this.  She had pulmonary function testing in April 2023 with no significant findings .  She says her breathing is better since we last saw her.  She lives by herself and takes care of her own chores.  Sounds like she is limited by some back pain.  But with her activities of daily living she is not describing chest pressure, neck or arm discomfort.  She is not describing new shortness of breath, PND or orthopnea.  She is not having any new palpitations, presyncope or syncope.  She is having no weight gain or edema.  ROS: As stated in the HPI and negative for all other systems.  Studies Reviewed:    EKG:   EKG Interpretation Date/Time:  Wednesday March 20 2023 08:58:50 EST Ventricular Rate:  54 PR Interval:  176 QRS Duration:  82 QT Interval:  450 QTC Calculation: 426 R Axis:   8  Text Interpretation: Sinus bradycardia Minimal voltage criteria for LVH, may be normal variant ( R in aVL ) Nonspecific T wave abnormality When compared with ECG of 14-Nov-2020 10:34, No significant change was found Confirmed by Rollene Rotunda ([id number]) on 03/20/2023 9:12:51 AM    Risk Assessment/Calculations:              Physical Exam:   VS:  BP 114/82 (BP Location: Left Arm, Patient Position: Sitting, Cuff Size: Normal)   Pulse (!) 54   Ht 5\' 5"  (1.651 m)   Wt 236 lb 6.4 oz  (107.2 kg)   SpO2 96%   BMI 39.34 kg/m    Wt Readings from Last 3 Encounters:  03/20/23 236 lb 6.4 oz (107.2 kg)  01/11/22 230 lb 12.8 oz (104.7 kg)  01/16/21 230 lb 12.8 oz (104.7 kg)     GEN: Well nourished, well developed in no acute distress NECK: No JVD; No carotid bruits CARDIAC: RRR, no murmurs, rubs, gallops RESPIRATORY:  Clear to auscultation without rales, wheezing or rhonchi  ABDOMEN: Soft, non-tender, non-distended EXTREMITIES:  No edema; No deformity, mildly decreased dorsalis pedis and posterior tibialis on the right  ASSESSMENT AND PLAN:   SOB:    This is improved from her previous perfusion testing.  I do not see a cardiac etiology.  No change in therapy.   CAD:   We are participating in primary risk reduction.  She has no acute symptoms.  I had a conversation about this.   HTN:    Her BP is controlled.  No change in therapy.    DYSLIPIDEMIA:    She said she recently had her statin dose increased and I will try to get the most recent results.  The goal should be an LDL in the 50s.  We also talked about diet.   DECREASED PERIPHERAL PULSES: I will order  ABIs and review the screening that was done at her primary office.  She is not having resting leg pain underand has no n follow-up with PCP on healing ulcers.   Follow up with me in 18 months.   Signed, Rollene Rotunda, MD

## 2023-03-20 ENCOUNTER — Encounter: Payer: Self-pay | Admitting: Cardiology

## 2023-03-20 ENCOUNTER — Ambulatory Visit: Payer: Medicare PPO | Attending: Cardiology | Admitting: Cardiology

## 2023-03-20 ENCOUNTER — Telehealth: Payer: Self-pay | Admitting: Emergency Medicine

## 2023-03-20 VITALS — BP 114/82 | HR 54 | Ht 65.0 in | Wt 236.4 lb

## 2023-03-20 DIAGNOSIS — I10 Essential (primary) hypertension: Secondary | ICD-10-CM

## 2023-03-20 DIAGNOSIS — E785 Hyperlipidemia, unspecified: Secondary | ICD-10-CM

## 2023-03-20 DIAGNOSIS — I2510 Atherosclerotic heart disease of native coronary artery without angina pectoris: Secondary | ICD-10-CM

## 2023-03-20 DIAGNOSIS — R0989 Other specified symptoms and signs involving the circulatory and respiratory systems: Secondary | ICD-10-CM

## 2023-03-20 DIAGNOSIS — R0602 Shortness of breath: Secondary | ICD-10-CM | POA: Diagnosis not present

## 2023-03-20 NOTE — Patient Instructions (Signed)
Medication Instructions:  No changes *If you need a refill on your cardiac medications before your next appointment, please call your pharmacy*  Testing/Procedures: Your physician has requested that you have an ankle brachial index (ABI). During this test an ultrasound and blood pressure cuff are used to evaluate the arteries that supply the arms and legs with blood. Allow thirty minutes for this exam. There are no restrictions or special instructions.  Please note: We ask at that you not bring children with you during ultrasound (echo/ vascular) testing. Due to room size and safety concerns, children are not allowed in the ultrasound rooms during exams. Our front office staff cannot provide observation of children in our lobby area while testing is being conducted. An adult accompanying a patient to their appointment will only be allowed in the ultrasound room at the discretion of the ultrasound technician under special circumstances. We apologize for any inconvenience.    Follow-Up: At [facility], you and your health needs are our priority.  As part of our continuing mission to provide you with exceptional heart care, we have created designated Provider Care Teams.  These Care Teams include your primary Cardiologist (physician) and Advanced Practice Providers (APPs -  Physician Assistants and Nurse Practitioners) who all work together to provide you with the care you need, when you need it.  We recommend signing up for the patient portal called "MyChart".  Sign up information is provided on this After Visit Summary.  MyChart is used to connect with patients for Virtual Visits (Telemedicine).  Patients are able to view lab/test results, encounter notes, upcoming appointments, etc.  Non-urgent messages can be sent to your provider as well.   To learn more about what you can do with MyChart, go to [email or web address].    Your next appointment:   18 month(s)  Provider:   Dr  Antoine Poche

## 2023-03-20 NOTE — Telephone Encounter (Signed)
Fax requesting most recent lab work, lipid panel, and ankle-brachial index/peripheral screening results

## 2023-03-23 ENCOUNTER — Other Ambulatory Visit: Payer: Self-pay | Admitting: Cardiology

## 2023-03-23 DIAGNOSIS — E785 Hyperlipidemia, unspecified: Secondary | ICD-10-CM

## 2023-03-29 ENCOUNTER — Ambulatory Visit (HOSPITAL_COMMUNITY)
Admission: RE | Admit: 2023-03-29 | Discharge: 2023-03-29 | Disposition: A | Discharge status: Home / Self Care | Payer: Medicare PPO | Source: Ambulatory Visit | Attending: Cardiology | Admitting: Cardiology

## 2023-03-29 DIAGNOSIS — R0989 Other specified symptoms and signs involving the circulatory and respiratory systems: Secondary | ICD-10-CM | POA: Insufficient documentation

## 2023-04-01 LAB — VAS US ABI WITH/WO TBI
Left ABI: 1.3
Right ABI: 1.19

## 2023-07-09 ENCOUNTER — Encounter: Payer: Self-pay | Admitting: Student

## 2023-10-14 ENCOUNTER — Emergency Department (HOSPITAL_COMMUNITY)

## 2023-10-14 ENCOUNTER — Encounter (HOSPITAL_COMMUNITY): Payer: Self-pay

## 2023-10-14 ENCOUNTER — Other Ambulatory Visit: Payer: Self-pay

## 2023-10-14 ENCOUNTER — Emergency Department (HOSPITAL_COMMUNITY)
Admission: EM | Admit: 2023-10-14 | Discharge: 2023-10-14 | Disposition: A | Discharge status: Home / Self Care | Attending: Emergency Medicine | Admitting: Emergency Medicine

## 2023-10-14 DIAGNOSIS — Z7982 Long term (current) use of aspirin: Secondary | ICD-10-CM | POA: Insufficient documentation

## 2023-10-14 DIAGNOSIS — Z7722 Contact with and (suspected) exposure to environmental tobacco smoke (acute) (chronic): Secondary | ICD-10-CM | POA: Diagnosis not present

## 2023-10-14 DIAGNOSIS — F419 Anxiety disorder, unspecified: Secondary | ICD-10-CM | POA: Insufficient documentation

## 2023-10-14 DIAGNOSIS — Z79899 Other long term (current) drug therapy: Secondary | ICD-10-CM | POA: Insufficient documentation

## 2023-10-14 DIAGNOSIS — R519 Headache, unspecified: Secondary | ICD-10-CM | POA: Insufficient documentation

## 2023-10-14 DIAGNOSIS — Z7729 Contact with and (suspected ) exposure to other hazardous substances: Secondary | ICD-10-CM

## 2023-10-14 DIAGNOSIS — I2510 Atherosclerotic heart disease of native coronary artery without angina pectoris: Secondary | ICD-10-CM | POA: Insufficient documentation

## 2023-10-14 DIAGNOSIS — R7989 Other specified abnormal findings of blood chemistry: Secondary | ICD-10-CM | POA: Diagnosis not present

## 2023-10-14 DIAGNOSIS — I10 Essential (primary) hypertension: Secondary | ICD-10-CM | POA: Insufficient documentation

## 2023-10-14 LAB — COMPREHENSIVE METABOLIC PANEL WITH GFR
ALT: 25 U/L (ref 0–44)
AST: 28 U/L (ref 15–41)
Albumin: 4.3 g/dL (ref 3.5–5.0)
Alkaline Phosphatase: 64 U/L (ref 38–126)
Anion gap: 11 (ref 5–15)
BUN: 11 mg/dL (ref 8–23)
CO2: 23 mmol/L (ref 22–32)
Calcium: 9.8 mg/dL (ref 8.9–10.3)
Chloride: 105 mmol/L (ref 98–111)
Creatinine, Ser: 1.1 mg/dL — ABNORMAL HIGH (ref 0.44–1.00)
GFR, Estimated: 53 mL/min — ABNORMAL LOW (ref >60)
Glucose, Bld: 116 mg/dL — ABNORMAL HIGH (ref 70–99)
Potassium: 3.8 mmol/L (ref 3.5–5.1)
Sodium: 139 mmol/L (ref 135–145)
Total Bilirubin: 0.5 mg/dL (ref 0.0–1.2)
Total Protein: 6.9 g/dL (ref 6.5–8.1)

## 2023-10-14 LAB — CBC
HCT: 41.6 % (ref 36.0–46.0)
Hemoglobin: 14 g/dL (ref 12.0–15.0)
MCH: 30.8 pg (ref 26.0–34.0)
MCHC: 33.7 g/dL (ref 30.0–36.0)
MCV: 91.4 fL (ref 80.0–100.0)
Platelets: 238 10*3/uL (ref 150–400)
RBC: 4.55 MIL/uL (ref 3.87–5.11)
RDW: 13.8 % (ref 11.5–15.5)
WBC: 7.3 10*3/uL (ref 4.0–10.5)
nRBC: 0 % (ref 0.0–0.2)

## 2023-10-14 MED ORDER — KETOROLAC TROMETHAMINE 15 MG/ML IJ SOLN: 15.0000 mg | Freq: Once | INTRAMUSCULAR | Status: DC

## 2023-10-14 MED ORDER — KETOROLAC TROMETHAMINE 15 MG/ML IJ SOLN
15.0000 mg | Freq: Once | INTRAMUSCULAR | Status: AC
  Administered 2023-10-14: 15 mg via INTRAMUSCULAR
  Filled 2023-10-14: qty 1

## 2023-10-14 NOTE — ED Provider Notes (Signed)
 [place] [facility] DEPARTMENT AT [facility] Provider Note   CSN: [id number] Arrival date & time: 10/14/23  0053     History  Chief Complaint  Patient presents with   Poisoning   Headache    [name] [name] is a 73 y.o. female presents today for possible ingestion of antifreeze.  Patient is that she was trying to get rid of mice in her house by mixing antifreeze with peanut butter on plates that were on top of a plastic bag.  Patient states that the smell was very strong and she has had a strong headache with elevated blood pressures since about 1830 last night.  Patient also states that she had some cabbage fall onto the plastic bag that had had the plates with antifreeze on them, however she did thoroughly wash the cabbage before eating only a single bite.  Patient denies nausea, vomiting, fever, chills, vision changes, chest pain, headache, any other complaints at this time.   Headache      Home Medications Prior to Admission medications   Medication Sig Start Date End Date Taking? Authorizing Provider  acetaminophen  (TYLENOL ) 650 MG CR tablet Take 1,300 mg by mouth every 8 (eight) hours as needed for pain.    [provider]  albuterol  (VENTOLIN  HFA) 108 (90 Base) MCG/ACT inhaler Inhale 2 puffs into the lungs every 6 (six) hours as needed for wheezing or shortness of breath. 06/18/19   Margaretann Sharper, MD  Ascorbic Acid (VITAMIN C) 100 MG CHEW Chew 100 mg by mouth daily.    [provider]  aspirin  81 MG tablet Take 81 mg by mouth daily.    [provider]  atorvastatin  (LIPITOR) 80 MG tablet TAKE 1 TABLET(80 MG) BY MOUTH DAILY 05/21/22   Eilleen Grates, MD  bimatoprost (LUMIGAN) 0.01 % SOLN Place 1 drop into the left eye at bedtime.    [provider]  bisoprolol -hydrochlorothiazide  (ZIAC ) 5-6.25 MG tablet TAKE 2 TABLETS BY MOUTH DAILY 01/04/21   Tysinger, Christiane Cowing, PA-C  estradiol (ESTRACE) 0.1 MG/GM vaginal cream Place 1  Applicatorful vaginally as needed.    [provider]  ezetimibe (ZETIA) 10 MG tablet Take 10 mg by mouth daily. 01/16/23   [provider]  felodipine  (PLENDIL ) 10 MG 24 hr tablet TAKE 1 TABLET(10 MG) BY MOUTH DAILY Patient taking differently: Take 10 mg by mouth daily. 06/01/20   Lalonde, John C, MD  FIBER PO Take 500 mg by mouth daily.    [provider]  fluticasone furoate-vilanterol (BREO ELLIPTA ) 100-25 MCG/INH AEPB INHALE 1 PUFF INTO THE LUNGS DAILY Patient taking differently: Inhale 1 puff into the lungs at bedtime. 12/12/20   Olalere, Ona Bidding A, MD  IRON PO Take 1,000 mg by mouth daily at 6 (six) AM.    [provider]  linaclotide (LINZESS) 145 MCG CAPS capsule Take 145 mcg by mouth daily before breakfast. Patient not taking: Reported on 03/20/2023    [provider]  Multiple Vitamin (MULTIVITAMIN) capsule Take 1 capsule by mouth daily.      [provider]  Omega-3 Fatty Acids (FISH OIL PO) Take 2 capsules by mouth daily.    [provider]  omeprazole  (PRILOSEC) 40 MG capsule Take 40 mg by mouth daily. 11/30/20   [provider]  PREVIDENT 5000 SENSITIVE 1.1-5 % GEL daily.    [provider]  solifenacin  (VESICARE ) 5 MG tablet Take 5 mg by mouth daily. 12/09/20   [provider]  triamcinolone  ointment (KENALOG ) 0.1 % Apply 1 Application topically as needed. 01/25/23   [provider]  vitamin E 400 UNIT capsule Take 400 Units by mouth daily.    [provider]  XIIDRA 5 % SOLN Place 1 drop into both eyes 2 (two) times daily. 06/23/20   [provider]      Allergies    Crestor [rosuvastatin] and Penicillins    Review of Systems   Review of Systems  Neurological:  Positive for headaches.    Physical Exam Updated Vital Signs BP (!) 162/86 (BP Location: Right Arm)   Pulse (!) 52   Temp 97.9 F (36.6 C) (Oral)   Resp 16   Ht 5\' 5"  (1.651 m)   Wt 107.2 kg   SpO2  96%   BMI 39.33 kg/m  Physical Exam Vitals and nursing note reviewed.  Constitutional:      General: She is not in acute distress.    Appearance: She is well-developed. She is not ill-appearing, toxic-appearing or diaphoretic.  HENT:     Head: Normocephalic and atraumatic.     Mouth/Throat:     Mouth: Mucous membranes are moist.     Pharynx: Oropharynx is clear.  Eyes:     Extraocular Movements: Extraocular movements intact.     Conjunctiva/sclera: Conjunctivae normal.     Pupils: Pupils are equal, round, and reactive to light.  Cardiovascular:     Rate and Rhythm: Normal rate and regular rhythm.     Heart sounds: Normal heart sounds. No murmur heard. Pulmonary:     Effort: Pulmonary effort is normal. No respiratory distress.     Breath sounds: Normal breath sounds.  Abdominal:     Palpations: Abdomen is soft.     Tenderness: There is no abdominal tenderness.  Musculoskeletal:        General: No swelling.     Cervical back: Neck supple.  Skin:    General: Skin is warm and dry.     Capillary Refill: Capillary refill takes less than 2 seconds.  Neurological:     Mental Status: She is alert and oriented to person, place, and time.     GCS: GCS eye subscore is 4. GCS verbal subscore is 5. GCS motor subscore is 6.     Cranial Nerves: No cranial nerve deficit.  Psychiatric:        Mood and Affect: Mood is anxious.     ED Results / Procedures / Treatments   Labs (all labs ordered are listed, but only abnormal results are displayed) Labs Reviewed  COMPREHENSIVE METABOLIC PANEL WITH GFR - Abnormal; Notable for the following components:      Result Value   Glucose, Bld 116 (*)    Creatinine, Ser 1.10 (*)    GFR, Estimated 53 (*)    All other components within normal limits  CBC    EKG None  Radiology CT Head Wo Contrast Result Date: 10/14/2023 CLINICAL DATA:  73 year old female with persistent headache after working with antifreeze trying to get rid of mice in her  house. EXAM: CT HEAD WITHOUT CONTRAST TECHNIQUE: Contiguous axial images were obtained from the base of the skull through the vertex without intravenous contrast. RADIATION DOSE REDUCTION: This exam was performed according to the departmental dose-optimization program which includes automated exposure control, adjustment of the mA and/or kV according to patient size and/or use of iterative reconstruction technique. COMPARISON:  Brain MRI 10/07/2004.  Head CT 11/16/2008. FINDINGS: Brain: Cerebral volume is within normal  limits for age. No midline shift, ventriculomegaly, mass effect, evidence of mass lesion, intracranial hemorrhage or evidence of cortically based acute infarction. Chronic, but progressed since 2010 widely scattered Patchy and confluent cerebral white matter hypodensity. Small but circumscribed lacunar infarct in the left deep gray nuclei (coronal image 34). No cortical encephalomalacia identified. Vascular: Calcified atherosclerosis at the skull base. No suspicious intracranial vascular hyperdensity. Skull: Intact.  No acute osseous abnormality identified. Sinuses/Orbits: Visualized paranasal sinuses and mastoids are clear. Other: Visualized orbits and scalp soft tissues are within normal limits. IMPRESSION: 1. No acute intracranial abnormality. 2. Progressed since 20/10 and moderately age advanced white matter changes most commonly due to small vessel disease. 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 Electronically Signed   By: Marlise Simpers M.D.   On: 10/14/2023 06:18    Procedures Procedures    Medications Ordered in ED Medications  ketorolac (TORADOL) 15 MG/ML injection 15 mg (has no administration in time range)    ED Course/ Medical Decision Making/ A&P                                 Medical Decision Making Amount and/or Complexity of Data Reviewed Labs: ordered. Radiology: ordered.   This patient presents to the ED for concern of HA/poisoning, this  involves an extensive number of treatment options, and is a complaint that carries with it a high risk of complications and morbidity.  The differential diagnosis includes poisoning, headache, hypertension   Co morbidities / Chronic conditions that complicate the patient evaluation  Hypertension, GERD, anemia, eczema, CAD   Lab Tests:  I Ordered, and personally interpreted labs.  The pertinent results include: Mildly elevated creatinine at 1.10   Imaging Studies ordered:  I ordered imaging studies including CT Head Noncon  I independently visualized and interpreted imaging which showed no acute intracranial abnormalities I agree with the radiologist interpretation   Cardiac Monitoring: / EKG:  The patient was maintained on a cardiac monitor.  I personally viewed and interpreted the cardiac monitored which showed an underlying rhythm of: Sinus bradycardia   Problem List / ED Course / Critical interventions / Medication management I ordered medication including Toradol I have reviewed the patients home medicines and have made adjustments as needed   Consultations Obtained: Ernestina Headland with poison control, discussed case and he felt that no further evaluation was necessary given that the likely ingested amount was none to extremely minimal.   Test / Admission - Considered:  Considered for admission or further workup however patient's vital signs, physical exam, labs, and imaging have been reassuring.  Patient has been cleared by poison control.  Patient given Toradol for headache and advised to take antifreeze out from inside her home.  Patient given return precautions and advised to follow-up with primary care if her symptoms persist.  I feel patient is safe for discharge at this time.        Final Clinical Impression(s) / ED Diagnoses Final diagnoses:  Nonintractable headache, unspecified chronicity pattern, unspecified headache type  Exposure to potentially hazardous  substances    Rx / DC Orders ED Discharge Orders     None         Carie Charity, PA-C 10/14/23 4034    Iva Mariner, MD 10/14/23 [phone]

## 2023-10-14 NOTE — ED Triage Notes (Addendum)
 Pt states that she was trying to get rid of mice in her house by soaking bait with antifreeze. Pt states that the smell was very strong and has had a headache and htn since about 1830 tonight. Pt states that she also may have had a bite of food that had brief contact with the antifreeze but she washed it thoroughly before eating it. No other sxs.

## 2023-10-14 NOTE — Discharge Instructions (Addendum)
 Today you were seen for headache and possible hazardous exposure.  It is unlikely that you were exposed to enough antifreeze to adversely affect you.  I suspect your headache is likely due to the smell of the antifreeze, please remove this from inside your home.  Please follow-up with your primary care if your symptoms persist for further evaluation and workup.  Please return to the ED if you have onset of double vision, uncontrollable vomiting, or fever that does not go down with Tylenol  or Motrin.  Thank you for letting us  treat you today. After reviewing your labs and imaging, I feel you are safe to go home. Please follow up with your PCP in the next several days and provide them with your records from this visit. Return to the Emergency Room if pain becomes severe or symptoms worsen.

## 2024-04-06 ENCOUNTER — Other Ambulatory Visit: Payer: Self-pay | Admitting: Obstetrics and Gynecology

## 2024-04-06 DIAGNOSIS — R928 Other abnormal and inconclusive findings on diagnostic imaging of breast: Secondary | ICD-10-CM

## 2024-04-11 ENCOUNTER — Inpatient Hospital Stay
Admission: RE | Admit: 2024-04-11 | Discharge: 2024-04-11 | Attending: Obstetrics and Gynecology | Admitting: Obstetrics and Gynecology

## 2024-04-11 DIAGNOSIS — R928 Other abnormal and inconclusive findings on diagnostic imaging of breast: Secondary | ICD-10-CM

## 2024-04-13 ENCOUNTER — Other Ambulatory Visit: Payer: Self-pay | Admitting: Obstetrics and Gynecology

## 2024-04-13 DIAGNOSIS — R921 Mammographic calcification found on diagnostic imaging of breast: Secondary | ICD-10-CM

## 2024-04-27 ENCOUNTER — Inpatient Hospital Stay
Admission: RE | Admit: 2024-04-27 | Discharge: 2024-04-27 | Attending: Obstetrics and Gynecology | Admitting: Obstetrics and Gynecology

## 2024-04-27 ENCOUNTER — Encounter

## 2024-04-27 ENCOUNTER — Ambulatory Visit
Admission: RE | Admit: 2024-04-27 | Discharge: 2024-04-27 | Disposition: A | Source: Ambulatory Visit | Attending: Obstetrics and Gynecology | Admitting: Obstetrics and Gynecology

## 2024-04-27 DIAGNOSIS — R921 Mammographic calcification found on diagnostic imaging of breast: Secondary | ICD-10-CM

## 2024-04-27 HISTORY — PX: BREAST BIOPSY: SHX20

## 2024-04-28 LAB — SURGICAL PATHOLOGY

## 2024-05-11 ENCOUNTER — Ambulatory Visit: Payer: Self-pay | Admitting: Surgery

## 2024-05-11 DIAGNOSIS — D0511 Intraductal carcinoma in situ of right breast: Secondary | ICD-10-CM

## 2024-05-11 NOTE — Progress Notes (Signed)
 "   REFERRING PHYSICIAN:  McComb, Norleen RAMAN, MD PROVIDER:  DEBBY CURTISTINE SHIPPER, MD MRN: [id number] DOB: Oct 28, 1950 DATE OF ENCOUNTER: 05/11/2024 Subjective    Chief Complaint: New Consultation   History of Present Illness: [name] [name] is a 74 y.o. female who is seen today as an office consultation for evaluation of New Consultation   The patient presents for evaluation of abnormal mammogram.  She had a 5 cm cluster of pleomorphic calcifications lower outer quadrant noted in the right breast.  Core biopsy showed DCIS.  No history of breast pain, breast mass or nipple discharge.    Review of Systems: A complete review of systems was obtained from the patient.  I have reviewed this information and discussed as appropriate with the patient.  See HPI as well for other ROS.     Medical History: Past Medical History:  Diagnosis Date   Anemia    Arthritis    DVT (deep venous thrombosis) (CMS/HHS-HCC)    GERD (gastroesophageal reflux disease)    Glaucoma (increased eye pressure)    Hypertension     There is no problem list on file for this patient.   Past Surgical History:  Procedure Laterality Date   HYSTERECTOMY       Allergies  Allergen Reactions   Rosuvastatin Other (See Comments)    SEVER HEAD PAIN  SEVER HEAD PAIN   rosuvastatin   Penicillins Other (See Comments)    Current Outpatient Medications on File Prior to Visit  Medication Sig Dispense Refill   acetaminophen  (TYLENOL ) 650 MG ER tablet Take 1,300 mg by mouth every 8 (eight) hours as needed for Pain     ascorbic acid, vitamin C, 100 mg Chew Take 100 mg by mouth     aspirin  81 mg tablet Take 81 mg by mouth once daily     atorvastatin  (LIPITOR) 80 MG tablet      bisoproloL -hydroCHLOROthiazide  (ZIAC ) 5-6.25 mg tablet Take 2 tablets by mouth once daily     clotrimazole-betamethasone (LOTRISONE) 1-0.05 % cream APPLY TOPICALLY TO THE AFFECTED AND SURROUNDING AREAS TWICE DAILY IN THE MORNING AND IN THE  EVENING AS NEEDED     ezetimibe (ZETIA) 10 mg tablet Take 10 mg by mouth once daily     famotidine (PEPCID) 20 MG tablet TAKE 1 TABLET BY MOUTH TWICE DAILY AS NEEDED FOR REFLUX SYMTPOMS     felodipine  (PLENDIL ) 10 MG ER tablet      fluticasone furoate-vilanteroL (BREO ELLIPTA ) 100-25 mcg/dose DsDv inhaler INHALE 1 PUFF BY MOUTH AT THE SAME TIME EVERY DAY     gabapentin (NEURONTIN) 100 MG capsule TAKE 1 CAPSULE BY MOUTH EVERY 8 HOURS AS NEEDED FOR TINGLING/BURNING IN LEG/FEET     ibandronate (BONIVA) 150 mg tablet 1 PO Q MONTH     linaCLOtide (LINZESS) 145 mcg capsule Take 145 mcg by mouth     LUMIGAN 0.01 % ophthalmic solution PLACE 1 DROP IN LEFT EYE AT BEDTIME     methocarbamoL (ROBAXIN) 500 MG tablet TAKE 1 TO 2 TABLETS BY MOUTH EVERY 6 TO 8 HOURS AS NEEDED FOR SPASMS OR MUSCLE TENSION     PREVIDENT 5000 SENSITIVE 1.1-5 % USE AS DIRECTED BY PROVIDER     solifenacin  (VESICARE ) 10 MG tablet Take 10 mg by mouth once daily     sucralfate  (CARAFATE ) 1 gram tablet TAKE 1 TABLET BY MOUTH FOUR TIMES DAILY AS NEEDED FOR REFLUX OR SYMPTOMS     vitamin E 400 UNIT capsule Take 400 Units by  mouth once daily     XIIDRA 5 % ophthalmic solution Place 1 drop into both eyes 2 (two) times daily     No current facility-administered medications on file prior to visit.    Family History  Problem Relation Age of Onset   Obesity Mother    High blood pressure (Hypertension) Mother    Diabetes Mother    High blood pressure (Hypertension) Father    Diabetes Father    Obesity Sister    Heart valve disease Brother    High blood pressure (Hypertension) Brother    Stroke Son      Social History   Tobacco Use  Smoking Status Never  Smokeless Tobacco Never     Social History   Socioeconomic History   Marital status: Widowed  Tobacco Use   Smoking status: Never   Smokeless tobacco: Never  Substance and Sexual Activity   Alcohol use: Never   Drug use: Never   Social  Drivers of Corporate Investment Banker Strain: Low Risk (09/23/2023)   Received from [facility]   Overall Financial Resource Strain (CARDIA)    Difficulty of Paying Living Expenses: Not hard at all  Food Insecurity: No Food Insecurity (09/23/2023)   Received from [facility]   Hunger Vital Sign    Within the past 12 months, you worried that your food would run out before you got the money to buy more.: Never true    Within the past 12 months, the food you bought just didn't last and you didn't have money to get more.: Never true  Transportation Needs: No Transportation Needs (09/23/2023)   Received from [facility] - Transportation    Lack of Transportation (Medical): No    Lack of Transportation (Non-Medical): No  Housing Stability: Unknown (05/11/2024)   Housing Stability Vital Sign    Homeless in the Last Year: No    Objective:   Vitals:   05/11/24 0904 05/11/24 0905  BP: (!) 157/90   Pulse: 102   Temp: 36.5 C (97.7 F)   SpO2: 95%   Weight: (!) 105.9 kg (233 lb 6.4 oz)   Height: 162.6 cm (5' 4)   PainSc:  0-No pain    Body mass index is 40.06 kg/m.  Physical Exam Exam conducted with a chaperone present.  Pulmonary:     Effort: Pulmonary effort is normal.  Chest:  Breasts:    Right: Normal.     Left: Normal.  Musculoskeletal:     Cervical back: Normal range of motion.  Lymphadenopathy:     Upper Body:     Right upper body: No axillary adenopathy.     Left upper body: No axillary adenopathy.  Skin:    General: Skin is warm.  Neurological:     General: No focal deficit present.  Psychiatric:        Mood and Affect: Mood normal.        Labs, Imaging and Diagnostic Testing:  FINAL DIAGNOSIS        1. Breast, right, needle core biopsy, stereotactic core biopsy of calcifications, coil clip :      DUCTAL CARCINOMA IN SITU, WITH APOCRINE FEATURES, INTERMEDIATE TO HIGH GRADE       NECROSIS: PRESENT       CALCIFICATIONS: PRESENT        DCIS LENGTH: 1.5 CM       SEE NOTE        Diagnosis Note : Dr. Frutoso reviewed the  case and concurs with the       interpretation.  A breast prognostic profile (ER and PR) is pending and will be       reported in an addendum. [facility] was notified       on 04/28/2024.       DATE SIGNED OUT: 04/28/2024  ELECTRONIC SIGNATURE : Pepper Dutton Md, Pathologist, Electronic Signature   MICROSCOPIC DESCRIPTION   CASE COMMENTS  STAINS USED IN DIAGNOSIS:  H&E-2  H&E-3  H&E-4  H&E  H&E-2  H&E-3  H&E-4  H&E  H&E-2  H&E-3  H&E-4  H&E  *RECUT 1 SLIDE  Stains used in diagnosis 1 ER-ACIS, 1 PR-ACIS  Estrogen receptor (6F11), immunohistochemical stains are performed on formalin  fixed, paraffin embedded tissue using a 3,3-diaminobenzidine (DAB) chromogen  and Leica Bond Autostainer System.  The staining intensity of the nucleus is  scored manually and is reported as the percentage of tumor cell nuclei  demonstrating specific nuclear staining.Specimens are fixed in 10% Neutral  Buffered Formalin for at least 6 hours and up to 72 hours.  These tests have not  be validated on decalcified tissue.  Results should be interpreted with caution  given the possibility of false negative results on decalcified specimens.  PR progesterone receptor (16), immunohistochemical stains are performed on  formalin fixed, paraffin embedded tissue using a 3,3-diaminobenzidine (DAB)  chromogen and Leica Bond Autostainer System.  The staining intensity of the  nucleus is scored manually and is reported as the percentage of tumor cell  nuclei demonstrating specific nuclear staining.Specimens are fixed in 10%  Neutral Buffered Formalin for at least 6 hours and up to 72 hours. These tests  have not be validated on decalcified tissue.  Results should be interpreted with  caution given the possibility of false negative results on decalcified  specimens.   ADDENDUM  1) Breast, right,  needle core biopsy, stereotactic core biopsy of calcifications, coil clip  PROGNOSTIC INDICATORS   Results:  IMMUNOHISTOCHEMICAL AND MORPHOMETRIC ANALYSIS PERFORMED MANUALLY  Estrogen Receptor:  30%, POSITIVE, WEAK STAINING INTENSITY  Progesterone Receptor:  0%, NEGATIVE  COMMENT:  The negative hormone receptor study(ies) in this case has no internal positive control.    REFERENCE RANGE ESTROGEN RECEPTOR  NEGATIVE     0%  POSITIVE       =>1%  REFERENCE RANGE PROGESTERONE RECEPTOR  NEGATIVE     0%  POSITIVE        =>1%  All controls stained appropriately  Belvie Come, John, Pathologist, Electronic Signature  ( Signed 12 24 2025)    CLINICAL HISTORY   SPECIMEN(S) OBTAINED  1. Breast, right, needle core biopsy, Stereotactic Core Biopsy Of  Calcifications, Coil Clip   SPECIMEN COMMENTS:  1. TIF: 12:00, CIT: 4 minutes  SPECIMEN CLINICAL INFORMATION:  1. Linear calcifications  CLINICAL DATA:  74 year old woman recalled for RIGHT breast  calcifications.   EXAM:  DIGITAL DIAGNOSTIC UNILATERAL RIGHT MAMMOGRAM   TECHNIQUE:  Right digital diagnostic mammography was performed.   COMPARISON:  Previous exam(s).   ACR Breast Density Category b: There are scattered areas of  fibroglandular density.   FINDINGS:  RIGHT:   Mammogram: Full field ML and spot magnification CC and ML views of  the RIGHT breast were obtained.   Linearly distributed coarse heterogeneous calcifications are seen in  the posterior depth of the lower inner RIGHT breast, spanning  approximately 5.3 cm.   IMPRESSION:  Indeterminate linearly distributed calcifications seen at  the  posterior depth of the lower inner RIGHT breast, spanning  approximately 5.3 cm.   RECOMMENDATION:  Stereotactic guided biopsy of anterior and posterior extents of  RIGHT lower inner breast calcifications.   I have discussed the findings and recommendations with the patient.  The biopsy procedure was explained to the patient  and questions were  answered. Patient expressed their understanding of the biopsy  recommendation.   Patient will be scheduled for biopsy at her earliest convenience by  the schedulers.   Ordering provider will be notified. If applicable, a reminder letter  will be sent to the patient regarding the next appointment.   BI-RADS CATEGORY  4: Suspicious.    Electronically Signed    By: Aliene Lloyd M.D.   Assessment and Plan:     Diagnoses and all orders for this visit:  Ductal carcinoma in situ (DCIS) of right breast -     Ambulatory Referral to Oncology-Medical -     Ambulatory Referral to Radiation Oncology    Discussed breast conserving surgery as well as mastectomy with reconstruction.  Discussed the pathophysiology of DCIS as well as the potential need for adjuvant therapies  She is opted for right breast seed localized lumpectomy.  Discussed long-term survival, local regional recurrence, quality of life with different surgical approaches including cosmesis.  The procedure has been discussed with the patient. Alternatives to surgery have been discussed with the patient.  Risks of surgery include bleeding,  Infection,  Seroma formation, death,  and the need for further surgery.   The patient understands and wishes to proceed.     DEBBY CURTISTINE SHIPPER, MD    I spent a total of 47 minutes in both face-to-face and non-face-to-face activities, excluding procedures performed, for this visit on the date of this encounter.      "

## 2024-05-13 ENCOUNTER — Other Ambulatory Visit: Payer: Self-pay | Admitting: Surgery

## 2024-05-13 DIAGNOSIS — D0511 Intraductal carcinoma in situ of right breast: Secondary | ICD-10-CM

## 2024-05-14 ENCOUNTER — Telehealth: Payer: Self-pay | Admitting: Radiation Oncology

## 2024-05-14 NOTE — Telephone Encounter (Signed)
 Spoke to pt to schedule initial consultation with Dr. Maritza. Pt agreeable to 1/15@1030am 

## 2024-05-17 DIAGNOSIS — D0511 Intraductal carcinoma in situ of right breast: Secondary | ICD-10-CM | POA: Insufficient documentation

## 2024-05-17 NOTE — Progress Notes (Signed)
 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 female seen in consultation for radiation therapy.  She presented after screening mammogram in November 2025 showed R breast calcifications requiring further work up. Diagnostic R breast mammogram demonstrated indeterminate linear calcifications spanning 5.3 cm, BI-RADS 4, with stereotactic biopsy recommended for further evaluation. Biopsy obtained on 04/27/24 and demonstrated intermediate to high grade DCIS with apocrine features and necrosis. ER+ at 30% and PR- at 0%.    She saw Dr. Vanderbilt in general surgery on 05/11/24. He discussed mastectomy and lumpectomy and the patient opted to proceed with seed localized lumpectomy. This is scheduled for 06/03/24.  Family history of cancer: maternal aunt had blood cancer, paternal aunt had breast cancer, maternal cousin died from breast cancer in her 63s, another maternal cousin had breast cancer, father had lung cancer  PREVIOUS RADIATION THERAPY: No  AUTOIMMUNE DISEASE: No  MEDICAL DEVICES: No  PREGNANCY: No   PAST MEDICAL HISTORY:  Past Medical History:  Diagnosis Date   Anemia    Arthritis    Blood in stool    GI consult 2012, EGD and colonoscopy   CAD (coronary artery disease)    Eczema    GERD (gastroesophageal reflux disease)    H/O bone density study 07/09/2006   [facility]openia; [facility]   Hemorrhoid    internal and external, general surgery consult 04/2011   Hyperlipidemia    Hypertension    Osteopenia        PAST SURGICAL HISTORY: Past Surgical History:  Procedure Laterality Date   ABDOMINAL HYSTERECTOMY      BREAST BIOPSY Right 04/27/2024   MM RT BREAST BX W LOC DEV 1ST LESION IMAGE BX SPEC STEREO GUIDE 04/27/2024 [facility]   COLONOSCOPY  2012, 2008   Dr. Kristie   ESOPHAGOGASTRODUODENOSCOPY  2012, 2008   Dr. Kristie     FAMILY HISTORY:  Family History  Problem Relation Age of Onset   Heart disease Mother        Pacemaker   Diabetes Mother    Cancer Father        lung   Diabetes Father    Diabetes Sister    Heart disease Sister    Hypertension Sister    Diabetes Brother    Heart disease Brother        1 brother died MI age 34 yo   Hypertension Brother    Stroke Brother    Cancer Other        maternal side,non first degree breast cancer   Hypertension Brother    Diabetes Brother    Heart disease Brother    Heart disease Brother    Hypertension Brother    Diabetes Brother    Hypertension Sister    Hypertension Sister    Diabetes Sister    Diabetes Sister    Hypertension Sister    Hypertension Sister    Diabetes Sister      SOCIAL HISTORY:  reports that she has never smoked. She has never used smokeless tobacco. She reports that she does not drink alcohol and  does not use drugs.   ALLERGIES: Crestor [rosuvastatin] and Penicillins   MEDICATIONS:  Current Outpatient Medications  Medication Sig Dispense Refill   acetaminophen  (TYLENOL ) 650 MG CR tablet Take 1,300 mg by mouth every 8 (eight) hours as needed for pain.     Ascorbic Acid (VITAMIN C) 100 MG CHEW Chew 100 mg by mouth daily.     aspirin  81 MG tablet Take 81 mg by mouth daily.     atorvastatin  (LIPITOR) 80 MG tablet TAKE 1 TABLET(80 MG) BY MOUTH DAILY 90 tablet 3   bimatoprost (LUMIGAN) 0.01 % SOLN Place 1 drop into the left eye at bedtime.     bisoprolol -hydrochlorothiazide  (ZIAC ) 5-6.25 MG tablet TAKE 2 TABLETS BY MOUTH DAILY 180 tablet 0   estradiol (ESTRACE) 0.1 MG/GM vaginal cream Place 1 Applicatorful vaginally as needed.     ezetimibe (ZETIA) 10 MG tablet Take 10 mg by mouth daily.      felodipine  (PLENDIL ) 10 MG 24 hr tablet TAKE 1 TABLET(10 MG) BY MOUTH DAILY 90 tablet 3   FIBER PO Take 500 mg by mouth daily.     fluticasone furoate-vilanterol (BREO ELLIPTA ) 100-25 MCG/INH AEPB INHALE 1 PUFF INTO THE LUNGS DAILY 60 each 5   IRON PO Take 1,000 mg by mouth daily at 6 (six) AM.     Multiple Vitamin (MULTIVITAMIN) capsule Take 1 capsule by mouth daily.       Omega-3 Fatty Acids (FISH OIL PO) Take 2 capsules by mouth daily.     PREVIDENT 5000 SENSITIVE 1.1-5 % GEL daily.     solifenacin  (VESICARE ) 5 MG tablet Take 5 mg by mouth daily.     triamcinolone  ointment (KENALOG ) 0.1 % Apply 1 Application topically as needed.     vitamin E 400 UNIT capsule Take 400 Units by mouth daily.     XDEMVY 0.25 % SOLN Place 0.25 drops into both eyes in the morning and at bedtime. Eyelid     XIIDRA 5 % SOLN Place 1 drop into both eyes 2 (two) times daily.     albuterol  (VENTOLIN  HFA) 108 (90 Base) MCG/ACT inhaler Inhale 2 puffs into the lungs every 6 (six) hours as needed for wheezing or shortness of breath. (Patient not taking: Reported on 05/21/2024) 18 g 11   FAMOTIDINE PO Take 1 tablet by mouth daily.     linaclotide (LINZESS) 145 MCG CAPS capsule Take 145 mcg by mouth daily before breakfast. (Patient not taking: Reported on 05/21/2024)     omeprazole  (PRILOSEC) 40 MG capsule Take 40 mg by mouth daily. (Patient not taking: Reported on 05/21/2024)     No current facility-administered medications for this encounter.     REVIEW OF SYSTEMS: The patient reports that they are doing well generally. Pertinent ROS per HPI and otherwise negative.      PHYSICAL EXAM:  Wt Readings from Last 3 Encounters:  05/21/24 231 lb 9.6 oz (105.1 kg)  10/14/23 236 lb 5.3 oz (107.2 kg)  03/20/23 236 lb 6.4 oz (107.2 kg)   Temp Readings from Last 3 Encounters:  05/21/24 (!) 97.4 F (36.3 C)  10/14/23 97.9 F (36.6 C) (Oral)  01/16/21 98.4 F (36.9 C) (Oral)   BP Readings from Last 3 Encounters:  05/21/24  (!) 154/75  10/14/23 (!) 162/86  03/20/23 114/82   Pulse Readings from Last 3 Encounters:  05/21/24 61  10/14/23 (!) 52  03/20/23 (!) 54   Pain Assessment Pain Score: 4  Pain Loc: Back (bilateral knees)/10  Physical Exam Vitals and nursing note reviewed.  Constitutional:      General: She is not in acute distress. HENT:     Head: Normocephalic and atraumatic.  Eyes:     Extraocular Movements: Extraocular movements intact.  Cardiovascular:     Rate and Rhythm: Normal rate.  Pulmonary:     Effort: Pulmonary effort is normal. No respiratory distress.  Abdominal:     General: There is no distension.  Musculoskeletal:        General: Normal range of motion.  Skin:    Coloration: Skin is not jaundiced or pale.  Neurological:     General: No focal deficit present.     Mental Status: She is alert.  Psychiatric:        Mood and Affect: Mood normal.     ECOG = 0   LABORATORY DATA:  Lab Results  Component Value Date   WBC 7.3 10/14/2023   HGB 14.0 10/14/2023   HCT 41.6 10/14/2023   MCV 91.4 10/14/2023   PLT 238 10/14/2023   Lab Results  Component Value Date   NA 139 10/14/2023   K 3.8 10/14/2023   CL 105 10/14/2023   CO2 23 10/14/2023   Lab Results  Component Value Date   ALT 25 10/14/2023   AST 28 10/14/2023   ALKPHOS 64 10/14/2023   BILITOT 0.5 10/14/2023      RADIOGRAPHY: MM RT BREAST BX W LOC DEV 1ST LESION IMAGE BX SPEC STEREO GUIDE Addendum Date: 04/28/2024 ADDENDUM REPORT: 04/28/2024 14:47 ADDENDUM: PATHOLOGY revealed: Breast, right, needle core biopsy, stereotactic core biopsy of calcifications, coil clip- DUCTAL CARCINOMA IN SITU, WITH APOCRINE FEATURES, INTERMEDIATE TO HIGH GRADE- NECROSIS: PRESENT- CALCIFICATIONS: PRESENT- DCIS LENGTH: 1.5 CM Pathology results are CONCORDANT with imaging findings, per Dr. Alm Parkins. Pathology results and recommendations below were discussed with patient by telephone. Patient reported biopsy site doing well with  no adverse symptoms, and only slight tenderness at the site. Post biopsy care instructions were reviewed, questions were answered and my direct phone number was provided. Patient was instructed to call [facility] for any additional questions or concerns related to biopsy site. RECOMMENDATION: Surgical and oncological consultation. Request for surgical consultation relayed to Olam Bunnell and Landry Finger at [facility]. The entire extent of calcifications will need to be localized by bracketing for surgical excision. Pathology results reported by Mliss CHARM Molt RN 04/28/2024. Electronically Signed   By: Alm Parkins M.D.   On: 04/28/2024 14:47   Result Date: 04/28/2024 CLINICAL DATA:  Patient presents for stereotactic core needle biopsy of right breast calcifications. EXAM: RIGHT BREAST STEREOTACTIC CORE NEEDLE BIOPSY COMPARISON:  Previous exam(s). FINDINGS: The patient and I discussed the procedure of stereotactic-guided biopsy including benefits and alternatives. We discussed the high likelihood of a successful procedure. We discussed the risks of the procedure including infection, bleeding, tissue injury, clip migration, and inadequate sampling. Informed written consent was given. The usual time out protocol was performed immediately prior to the procedure. Using sterile technique and 1% Lidocaine  as local anesthetic, under stereotactic guidance, a 9 gauge vacuum assisted device was used to perform core needle biopsy of calcifications in the lower inner quadrant of the right breast using a superior approach. Specimen radiograph was performed showing calcifications for which biopsy was performed. Specimens with calcifications are identified for pathology. Lesion quadrant: Lower inner quadrant At the conclusion of the procedure, a coil shaped tissue marker clip was deployed into the biopsy cavity.  Follow-up 2-view mammogram was performed and dictated separately. IMPRESSION:  Stereotactic-guided biopsy of right breast calcifications. No apparent complications. Electronically Signed: By: Alm Parkins M.D. On: 04/27/2024 12:05   MM CLIP PLACEMENT RIGHT Result Date: 04/27/2024 CLINICAL DATA:  Status post stereotactic core needle biopsy of right breast calcifications. Assess post biopsy marker clip placement. EXAM: 3D DIAGNOSTIC RIGHT MAMMOGRAM POST STEREOTACTIC BIOPSY COMPARISON:  Previous exam(s). ACR Breast Density Category b: There are scattered areas of fibroglandular density. FINDINGS: 3D Mammographic images were obtained following stereotactic guided biopsy of right breast calcifications. The biopsy marking clip is in expected position at the site of biopsy. IMPRESSION: Appropriate positioning of the coil shaped biopsy marking clip at the site of biopsy in the posterior, medial right breast adjacent to residual calcifications. Final Assessment: Post Procedure Mammograms for Marker Placement Electronically Signed   By: Alm Parkins M.D.   On: 04/27/2024 12:13    Diagnostic Unilateral R Mammogram 04/11/24:  CLINICAL DATA:  74 year old woman recalled for RIGHT breast calcifications.   EXAM: DIGITAL DIAGNOSTIC UNILATERAL RIGHT MAMMOGRAM   TECHNIQUE: Right digital diagnostic mammography was performed.   COMPARISON:  Previous exam(s).   ACR Breast Density Category b: There are scattered areas of fibroglandular density.   FINDINGS: RIGHT:   Mammogram: Full field ML and spot magnification CC and ML views of the RIGHT breast were obtained.   Linearly distributed coarse heterogeneous calcifications are seen in the posterior depth of the lower inner RIGHT breast, spanning approximately 5.3 cm.   IMPRESSION: Indeterminate linearly distributed calcifications seen at the posterior depth of the lower inner RIGHT breast, spanning approximately 5.3 cm.   RECOMMENDATION: Stereotactic guided biopsy of anterior and posterior extents of RIGHT lower inner breast  calcifications.   I have discussed the findings and recommendations with the patient. The biopsy procedure was explained to the patient and questions were answered. Patient expressed their understanding of the biopsy recommendation.   Patient will be scheduled for biopsy at her earliest convenience by the schedulers.   Ordering provider will be notified. If applicable, a reminder letter will be sent to the patient regarding the next appointment.   BI-RADS CATEGORY  4: Suspicious.     Bilateral Screening Mammogram 03/30/24:     PATHOLOGY:    R Breast Biopsy 04/27/24:  Accession #: [id number]  Patient Name: [name] [name], [name] [name]  Visit # : [id number]   MRN: [id number]  Physician: Parkins Alm  DOB/Age 05-12-1950 (Age: 75) Gender: F  Collected Date: 04/27/2024  Received Date: 04/27/2024   FINAL DIAGNOSIS        1. Breast, right, needle core biopsy, stereotactic core biopsy of calcifications, coil clip :       DUCTAL CARCINOMA IN SITU, WITH APOCRINE FEATURES, INTERMEDIATE TO HIGH GRADE       NECROSIS: PRESENT       CALCIFICATIONS: PRESENT       DCIS LENGTH: 1.5 CM     IMPRESSION/PLAN:   Patient with Stage 0 [id number] ductal carcinoma in situ of the R breast found on screening mammography who is seen pre-operatively for consideration of adjuvant radiation therapy. We discussed the role of breast irradiation in reducing the risk of local recurrence and improving long-term disease control.  We reviewed the logistics of treatment in detail, including the need for a CT simulation for treatment planning, followed by several days for contouring, dosimetry, and quality assurance prior to starting therapy.   She does not appear to be low risk in setting of her high grade  disease, presence of necrosis and the size of disease on imaging and so omission of radiation is not favored.   The planned course of radiation therapy will consist of daily treatments, Monday through Friday, over  approximately 3-4 weeks.   We reviewed that each treatment session typically lasts only a few minutes, though positioning and setup may take additional time. The patient should plan to be in the department for approximately 45 minutes per visit. She will be evaluated by me at least once weekly during treatment to monitor for side effects, assess tolerance, and ensure it is safe to continue therapy.  We discussed acute and subacute side effects which are generally gradual in onset and typically include fatigue, skin erythema, tanning or hyperpigmentation, breast edema or firmness and mild tenderness.  Less common but possible side effects include desquamation of the skin, decreased range of motion of the shoulder, and transient changes in breast size and texture.  Long-term risk such as cosmetic changes, rare risk of rib fracture and cardiopulmonary effects were also reviewed.  Reassured patient that most acute side effects improve over weeks to months after completing therapy.   Patient verbalized understanding of these risks and benefits and she is in agreement with the plan. I will see her in follow-up after surgery. Final treatment planning will depend on her postoperative pathology report, but we anticipate adjuvant whole breast radiation. Do not anticipate the need to radiate her axilla or any nodal regions.   Patient interested in potential consultation with genetics and I will reach out to them about this.  --  Total time spent today in preparation for this visit was 60 minutes. This included patient care, imaging and path review, documentation, multidisciplinary discussion and coordination of care and follow up.    Estefana HERO. Maritza, M.D.   "

## 2024-05-18 ENCOUNTER — Encounter: Payer: Self-pay | Admitting: *Deleted

## 2024-05-18 NOTE — Progress Notes (Signed)
 Last week scheduler [facility] called patient about med onc appt and she stated patient seemed confused as thought she just needed surgical appt. Confirmed with Dr. Loretha and she said she will see patient post op. Prognostics are to be repeated on the final path (surgery 1/28) and initial med onc appt made for 2/6 at 2pm. Navigator called patient to explain reason for appt and left voice mail with contact information. Will call back in morning.

## 2024-05-19 ENCOUNTER — Encounter: Payer: Self-pay | Admitting: *Deleted

## 2024-05-19 NOTE — Progress Notes (Signed)
" ° °  Location of Breast Cancer:  Diagnosis  D05.11 (ICD-10-CM) - Ductal carcinoma in situ (DCIS) of right breast    Histology per Pathology Report:   Receptor Status: ER(30%), PR (0%), Her2-neu (), Ki-67()  Did patient present with symptoms (if so, please note symptoms) or was this found on screening mammography?: Screening mammography  Past/Anticipated interventions by surgeon, if any: RIGHT BREAST SEED LUMPECTOMY  06/03/2024       Past/Anticipated interventions by medical oncology, if any: Chemotherapy   Lymphedema issues, if any:  Denies  Pain issues, if any:  Denies  SAFETY ISSUES: Prior radiation? No Pacemaker/ICD? No Possible current pregnancy?No Is the patient on methotrexate? No  Current Complaints / other details:        BP (!) 154/75 (BP Location: Left Arm, Patient Position: Sitting, Cuff Size: Large)   Pulse 61   Temp (!) 97.4 F (36.3 C)   Resp 20   Ht 5' 4 (1.626 m)   Wt 231 lb 9.6 oz (105.1 kg)   SpO2 95%   BMI 39.75 kg/m   "

## 2024-05-19 NOTE — Progress Notes (Signed)
 Called patient x 2 today to explain reasoning for having a medical oncology appt on Feb 6th at 2pm with Dr. Loretha. Left messages for her to call me back. Patient is having surgery on 1/28 and repeat progs were requested to be sent on that specimen that should be back by that date. Patient has navigator's phone number.

## 2024-05-20 ENCOUNTER — Encounter: Payer: Self-pay | Admitting: *Deleted

## 2024-05-21 ENCOUNTER — Ambulatory Visit
Admission: RE | Admit: 2024-05-21 | Discharge: 2024-05-21 | Disposition: A | Discharge status: Home / Self Care | Source: Ambulatory Visit | Attending: Radiation Oncology | Admitting: Radiation Oncology

## 2024-05-21 ENCOUNTER — Encounter: Payer: Self-pay | Admitting: Radiation Oncology

## 2024-05-21 ENCOUNTER — Encounter: Payer: Self-pay | Admitting: *Deleted

## 2024-05-21 VITALS — BP 154/75 | HR 61 | Temp 97.4°F | Resp 20 | Ht 64.0 in | Wt 231.6 lb

## 2024-05-21 DIAGNOSIS — Z923 Personal history of irradiation: Secondary | ICD-10-CM | POA: Insufficient documentation

## 2024-05-21 DIAGNOSIS — D0511 Intraductal carcinoma in situ of right breast: Secondary | ICD-10-CM | POA: Diagnosis present

## 2024-05-21 DIAGNOSIS — D649 Anemia, unspecified: Secondary | ICD-10-CM | POA: Insufficient documentation

## 2024-05-21 DIAGNOSIS — I2510 Atherosclerotic heart disease of native coronary artery without angina pectoris: Secondary | ICD-10-CM | POA: Diagnosis not present

## 2024-05-21 DIAGNOSIS — M8580 Other specified disorders of bone density and structure, unspecified site: Secondary | ICD-10-CM | POA: Diagnosis not present

## 2024-05-21 DIAGNOSIS — Z170 Estrogen receptor positive status [ER+]: Secondary | ICD-10-CM | POA: Insufficient documentation

## 2024-05-21 DIAGNOSIS — Z801 Family history of malignant neoplasm of trachea, bronchus and lung: Secondary | ICD-10-CM | POA: Diagnosis not present

## 2024-05-21 DIAGNOSIS — Z79899 Other long term (current) drug therapy: Secondary | ICD-10-CM | POA: Insufficient documentation

## 2024-05-21 DIAGNOSIS — Z803 Family history of malignant neoplasm of breast: Secondary | ICD-10-CM | POA: Diagnosis not present

## 2024-05-21 DIAGNOSIS — Z1722 Progesterone receptor negative status: Secondary | ICD-10-CM | POA: Insufficient documentation

## 2024-05-21 DIAGNOSIS — E785 Hyperlipidemia, unspecified: Secondary | ICD-10-CM | POA: Insufficient documentation

## 2024-05-21 DIAGNOSIS — Z7951 Long term (current) use of inhaled steroids: Secondary | ICD-10-CM | POA: Insufficient documentation

## 2024-05-21 DIAGNOSIS — I10 Essential (primary) hypertension: Secondary | ICD-10-CM | POA: Insufficient documentation

## 2024-05-21 DIAGNOSIS — Z7982 Long term (current) use of aspirin: Secondary | ICD-10-CM | POA: Diagnosis not present

## 2024-05-21 DIAGNOSIS — Z809 Family history of malignant neoplasm, unspecified: Secondary | ICD-10-CM | POA: Diagnosis not present

## 2024-05-21 NOTE — Progress Notes (Signed)
 Patient returned navigator's phone call about appts. She now is clear about her 3 different MDs and appointments. She is aware of Dr. Walter appt in Feb. Has Dr. Maritza today. Patient said she lost her husband in 2008 and he was a patient here at the [facility] so hard coming back. She has navigator's information if any needs/concerns.

## 2024-05-26 ENCOUNTER — Encounter: Payer: Self-pay | Admitting: *Deleted

## 2024-05-26 DIAGNOSIS — D0511 Intraductal carcinoma in situ of right breast: Secondary | ICD-10-CM

## 2024-05-26 NOTE — Progress Notes (Signed)
 Per Dr. Maritza, patient inquired about genetics. MD requested consult - order entered in EPIC.

## 2024-05-28 NOTE — Pre-Procedure Instructions (Signed)
 Surgical Instructions   Your procedure is scheduled on June 03, 2024. Report to [facility] Main Entrance A at 1:20 P.M., then check in with the Admitting office. Any questions or running late day of surgery: call [phone]  Questions prior to your surgery date: call [phone], Monday-Friday, 8am-4pm. If you experience any cold or flu symptoms such as cough, fever, chills, shortness of breath, etc. between now and your scheduled surgery, please notify us  at the above number.     Remember:  Do not eat after midnight the night before your surgery   You may drink clear liquids until 12:20PM the morning of your surgery.   Clear liquids allowed are: Water, Non-Citrus Juices (without pulp), Carbonated Beverages, Clear Tea (no milk, honey, etc.), Black Coffee Only (NO MILK, CREAM OR POWDERED CREAMER of any kind), and Gatorade.    Take these medicines the morning of surgery with A SIP OF WATER: atorvastatin  (LIPITOR)  bisoprolol -hydrochlorothiazide  (ZIAC ) 5-6.25 MG tablet  ezetimibe (ZETIA) famotidine (PEPCID)  felodipine  (PLENDIL )  fluticasone furoate-vilanterol (BREO ELLIPTA ) 100-25 MCG/INH AEPB  solifenacin  (VESICARE )   May take these medicines IF NEEDED: acetaminophen  (TYLENOL )    Follow your surgeon's instructions on stopping Aspirin . If no instructions were given, please contact your surgeon's office.   One week prior to surgery, STOP taking any  Aleve, Naproxen, Ibuprofen, Motrin, Advil, Goody's, BC's, all herbal medications, fish oil, and non-prescription vitamins.                     Do NOT Smoke (Tobacco/Vaping) for 24 hours prior to your procedure.  If you use a CPAP at night, you may bring your mask/headgear for your overnight stay.   You will be asked to remove any contacts, glasses, piercing's, hearing aid's, dentures/partials prior to surgery. Please bring cases for these items if needed.    Your surgeon will determine if you are to be admitted or  discharged the same day.  Patients discharged the day of surgery will not be allowed to drive home, and someone needs to stay with them for 24 hours.  SURGICAL WAITING ROOM VISITATION Patients may have no more than 2 support people in the waiting area - these visitors may rotate.   Pre-op nurse will coordinate an appropriate time for 2 ADULT support persons, who may not rotate, to accompany patient in pre-op.  Children under the age of 81 must have an adult with them who is not the patient and must remain in the main waiting area with an adult.  If the patient needs to stay at the hospital during part of their recovery, the visitor guidelines for inpatient rooms apply.  Please refer to the [facility] website for the visitor guidelines for any additional information.   If you received a COVID test during your pre-op visit  it is requested that you wear a mask when out in public, stay away from anyone that may not be feeling well and notify your surgeon if you develop symptoms. If you have been in contact with anyone that has tested positive in the last 10 days please notify you surgeon.      Pre-operative CHG Bathing Instructions   You can play a key role in reducing the risk of infection after surgery. Your skin needs to be as free of germs as possible. You can reduce the number of germs on your skin by washing with CHG (chlorhexidine gluconate) soap before surgery. CHG is an antiseptic soap that kills germs and continues  to kill germs even after washing.   DO NOT use if you have an allergy to chlorhexidine/CHG or antibacterial soaps. If your skin becomes reddened or irritated, stop using the CHG and notify one of our RNs at [phone].              TAKE A SHOWER THE NIGHT BEFORE SURGERY   Please keep in mind the following:  DO NOT shave, including legs and underarms, 48 hours prior to surgery.   You may shave your face before/day of surgery.  Place clean sheets on your bed the night  before surgery Use a clean washcloth (not used since being washed) for shower. DO NOT sleep with pet's night before surgery.  CHG Shower Instructions:  Wash your face and private area with normal soap. If you choose to wash your hair, wash first with your normal shampoo.  After you use shampoo/soap, rinse your hair and body thoroughly to remove shampoo/soap residue.  Turn the water OFF and apply half the bottle of CHG soap to a CLEAN washcloth.  Apply CHG soap ONLY FROM YOUR NECK DOWN TO YOUR TOES (washing for 3-5 minutes)  DO NOT use CHG soap on face, private areas, open wounds, or sores.  Pay special attention to the area where your surgery is being performed.  If you are having back surgery, having someone wash your back for you may be helpful. Wait 2 minutes after CHG soap is applied, then you may rinse off the CHG soap.  Pat dry with a clean towel  Put on clean pajamas    Additional instructions for the day of surgery: If you choose, you may shower the morning of surgery with an antibacterial soap.  DO NOT APPLY any lotions, deodorants, cologne, or perfumes.   Do not wear jewelry or makeup Do not wear nail polish, gel polish, artificial nails, or any other type of covering on natural nails (fingers and toes) Do not bring valuables to the hospital. [facility] is not responsible for valuables/personal belongings. Put on clean/comfortable clothes.  Please brush your teeth.  Ask your nurse before applying any prescription medications to the skin.

## 2024-05-29 ENCOUNTER — Encounter (HOSPITAL_COMMUNITY): Payer: Self-pay

## 2024-05-29 ENCOUNTER — Encounter (HOSPITAL_COMMUNITY)
Admission: RE | Admit: 2024-05-29 | Discharge: 2024-05-29 | Disposition: A | Source: Ambulatory Visit | Attending: Surgery

## 2024-05-29 ENCOUNTER — Other Ambulatory Visit: Payer: Self-pay

## 2024-05-29 VITALS — BP 155/83 | HR 59 | Temp 98.4°F | Resp 18 | Ht 64.0 in | Wt 232.8 lb

## 2024-05-29 DIAGNOSIS — E785 Hyperlipidemia, unspecified: Secondary | ICD-10-CM | POA: Diagnosis not present

## 2024-05-29 DIAGNOSIS — D649 Anemia, unspecified: Secondary | ICD-10-CM | POA: Insufficient documentation

## 2024-05-29 DIAGNOSIS — D0511 Intraductal carcinoma in situ of right breast: Secondary | ICD-10-CM | POA: Diagnosis not present

## 2024-05-29 DIAGNOSIS — I10 Essential (primary) hypertension: Secondary | ICD-10-CM | POA: Diagnosis not present

## 2024-05-29 DIAGNOSIS — Z01812 Encounter for preprocedural laboratory examination: Secondary | ICD-10-CM | POA: Diagnosis not present

## 2024-05-29 DIAGNOSIS — K219 Gastro-esophageal reflux disease without esophagitis: Secondary | ICD-10-CM | POA: Insufficient documentation

## 2024-05-29 DIAGNOSIS — I2510 Atherosclerotic heart disease of native coronary artery without angina pectoris: Secondary | ICD-10-CM | POA: Diagnosis not present

## 2024-05-29 DIAGNOSIS — Z01818 Encounter for other preprocedural examination: Secondary | ICD-10-CM

## 2024-05-29 DIAGNOSIS — Z9889 Other specified postprocedural states: Secondary | ICD-10-CM | POA: Diagnosis not present

## 2024-05-29 LAB — CBC
HCT: 40.6 % (ref 36.0–46.0)
Hemoglobin: 13.6 g/dL (ref 12.0–15.0)
MCH: 30.2 pg (ref 26.0–34.0)
MCHC: 33.5 g/dL (ref 30.0–36.0)
MCV: 90.2 fL (ref 80.0–100.0)
Platelets: 202 K/uL (ref 150–400)
RBC: 4.5 MIL/uL (ref 3.87–5.11)
RDW: 13.9 % (ref 11.5–15.5)
WBC: 4.9 K/uL (ref 4.0–10.5)
nRBC: 0 % (ref 0.0–0.2)

## 2024-05-29 LAB — BASIC METABOLIC PANEL WITH GFR
Anion gap: 10 (ref 5–15)
BUN: 20 mg/dL (ref 8–23)
CO2: 27 mmol/L (ref 22–32)
Calcium: 9.8 mg/dL (ref 8.9–10.3)
Chloride: 103 mmol/L (ref 98–111)
Creatinine, Ser: 1.03 mg/dL — ABNORMAL HIGH (ref 0.44–1.00)
GFR, Estimated: 57 mL/min — ABNORMAL LOW
Glucose, Bld: 99 mg/dL (ref 70–99)
Potassium: 4.3 mmol/L (ref 3.5–5.1)
Sodium: 140 mmol/L (ref 135–145)

## 2024-05-29 NOTE — Progress Notes (Addendum)
 PCP - Silva Christians, NP Cardiologist - Dr. Lynwood Baltimore  PPM/ICD - deneis Device Orders - na Rep Notified - na  Chest x-ray -  EKG - 10/14/2023 Stress Test - 05/28/2019 ECHO - 07/22/2019 Cardiac Cath -   Sleep Study - denies CPAP - na  Non-diabetic  Blood Thinner Instructions:denies Aspirin  Instructions:Follow surgeon's instructions.   ERAS Protcol -Clears until 1220  Anesthesia review: NO  Patient denies shortness of breath, fever, cough and chest pain at PAT appointment   All instructions explained to the patient, with a verbal understanding of the material. Patient agrees to go over the instructions while at home for a better understanding. Patient also instructed to self quarantine after being tested for COVID-19. The opportunity to ask questions was provided.

## 2024-06-01 NOTE — Progress Notes (Signed)
 Anesthesia Chart Review:  Case: [id number] Date/Time: 06/03/24 1504   Procedure: BREAST LUMPECTOMY WITH RADIOACTIVE SEED LOCALIZATION (Right: Breast) - RIGHT BREAST SEED LUMPECTOMY   Anesthesia type: General   Pre-op diagnosis: RIGHT DCIS   Location: [facility] ROOM 02 / [facility]   Surgeons: Vanderbilt Ned, MD       DISCUSSION: Patient is a 74 year old female scheduled for the above procedure.  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   Last cardiology visit with Dr. Lavona was on 03/20/2023.  Initially evaluated for dyspnea. She had a cardiac catheterization in 2006 with 30-50% proximal LAD stenosis otherwise mild disease. Normal echo then. She had a normal perfusion study in 05/2019. Last TTE in 07/2019 showed LVEF 60-65%, no wall motion abnormalities, mild LVH, grade 1 diastolic dysfunction, mildly reduced RV systolic function, normal PASP, trivial MR. At last visit, dyspnea had improved since 2021. He did not no see a cardiac etiology Continue primary risk reduction for CAD. On statin therapy. Follow-up in 18 months planned.  RSL is scheduled for 06/02/2024. Anesthesia team to evaluate on the day of surgery.  VS: BP (!) 155/83   Pulse (!) 59   Temp 36.9 C   Resp 18   Ht 5' 4 (1.626 m)   Wt 105.6 kg   SpO2 96%   BMI 39.96 kg/m    PROVIDERS: Campbell Reynolds, NP is PCP Lavona Agent, MD is cardiologist  Maritza Stagger, MD is RAD-[facility]   LABS: Labs reviewed: Acceptable for surgery. LFTs normal 10/14/2023. (all labs ordered are listed, but only abnormal results are displayed)  Labs Reviewed  BASIC METABOLIC PANEL WITH GFR - Abnormal; Notable for the following components:      Result Value   Creatinine, Ser 1.03 (*)    GFR, Estimated 57 (*)    All other components within normal limits  CBC     IMAGES: CT Head 10/14/2023: IMPRESSION: 1. No acute intracranial abnormality. 2. Progressed since 20/10 and  moderately age advanced white matter changes most commonly due to small vessel disease. 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   MRI C-spine 05/20/2022: IMPRESSION: Degenerative changes of the cervical spine most advanced at C5-C6 and C6-C7 where there is moderate left and mild right neural foraminal stenosis. No significant spinal canal stenosis or other significant neural foraminal stenosis.   US  Thyroid  06/05/2021: IMPRESSION: 1. Mildly atrophic and heterogeneous thyroid  gland with 2 discrete benign-appearing nodules in the left hemithyroid, not warranting additional ultrasound follow-up or tissue sampling. 2. No sonographic abnormality in the right submandibular palpable area of concern. - The above is in keeping with the ACR TI-RADS recommendations - J Am Coll Radiol 2017;14:587-595.    EKG: 10/14/2023: Sinus bradycardia at 59 bpm Minimal voltage criteria for LVH, may be normal variant ( R in aVL ) Borderline ECG When compared with ECG of 20-Mar-2023 08:58, PREVIOUS ECG IS PRESENT since last tracing no significant change Confirmed by Lenor Hollering [phone]) on 10/15/2023 1:14:18 PM  CV: Echo 07/22/2019: IMPRESSIONS   1. Left ventricular ejection fraction, by estimation, is 60 to 65%. The  left ventricle has normal function. The left ventricle has no regional  wall motion abnormalities. There is mild left ventricular hypertrophy.  Left ventricular diastolic parameters  are consistent with Grade I diastolic dysfunction (impaired relaxation).  The average left ventricular global longitudinal strain is -21.2 %.   2. Right ventricular systolic function is  mildly reduced. The right  ventricular size is normal. There is normal pulmonary artery systolic  pressure.   3. The mitral valve is grossly normal. Trivial mitral valve  regurgitation.   4. The aortic valve is tricuspid. Aortic valve regurgitation is not  visualized.   Nuclear stress test  05/28/2019: The left ventricular ejection fraction is normal (55-65%). Nuclear stress EF: 61%. No wall motion abnormality. The study is normal. No perfusion defects. This is a low risk study.   Cardiac cath 03/04/2005: IMPRESSIONS:  1.  Normal left ventricular systolic function, ejection fraction 65%.  2.  Moderate diffuse disease, especially of the left anterior descending      artery, constituting 30-50% stenosis in the mid to distal left anterior      descending artery.  Mild haziness is also noted in one of the distal      lesions, however, there is no evidence of ulcerative lesion or a      thrombotic lesion.  Diagonal-1 is very small and diffusely diseased.  3.  Patent renal arteries.  Past Medical History:  Diagnosis Date   Anemia    Arthritis    Blood in stool    GI consult 2012, EGD and colonoscopy   CAD (coronary artery disease)    Eczema    GERD (gastroesophageal reflux disease)    H/O bone density study 07/09/2006   osteopenia; ([facility])   Hemorrhoid    internal and external, general surgery consult 04/2011   Hyperlipidemia    Hypertension    Osteopenia     Past Surgical History:  Procedure Laterality Date   ABDOMINAL HYSTERECTOMY     BREAST BIOPSY Right 04/27/2024   MM RT BREAST BX W LOC DEV 1ST LESION IMAGE BX SPEC STEREO GUIDE 04/27/2024 [facility]   COLONOSCOPY  2012, 2008   Dr. Kristie   DILATION AND CURETTAGE OF UTERUS     ESOPHAGOGASTRODUODENOSCOPY  2012, 2008   Dr. Kristie    MEDICATIONS:  acetaminophen  (TYLENOL ) 650 MG CR tablet   albuterol  (VENTOLIN  HFA) 108 (90 Base) MCG/ACT inhaler   Ascorbic Acid (VITAMIN C) 100 MG CHEW   aspirin  81 MG tablet   atorvastatin  (LIPITOR) 80 MG tablet   bimatoprost (LUMIGAN) 0.01 % SOLN   bisoprolol -hydrochlorothiazide  (ZIAC ) 5-6.25 MG tablet   calcium  carbonate (TUMS - DOSED IN MG ELEMENTAL CALCIUM ) 500 MG chewable tablet   CALCIUM  PO   cholecalciferol (VITAMIN D3) 25 MCG (1000 UNIT) tablet    CRANBERRY PO   cyanocobalamin (VITAMIN B12) 500 MCG tablet   docusate sodium (COLACE) 100 MG capsule   estradiol (ESTRACE) 0.1 MG/GM vaginal cream   ezetimibe (ZETIA) 10 MG tablet   famotidine (PEPCID) 20 MG tablet   felodipine  (PLENDIL ) 10 MG 24 hr tablet   FIBER PO   fluticasone furoate-vilanterol (BREO ELLIPTA ) 100-25 MCG/INH AEPB   ibandronate (BONIVA) 150 MG tablet   IRON PO   Multiple Vitamin (MULTIVITAMIN) capsule   PREVIDENT 5000 SENSITIVE 1.1-5 % GEL   solifenacin  (VESICARE ) 10 MG tablet   TURMERIC PO   vitamin E 400 UNIT capsule   XDEMVY 0.25 % SOLN   XIIDRA 5 % SOLN   No current facility-administered medications for this encounter.    Isaiah Ruder, PA-C Surgical Short Stay/Anesthesiology [facility] Phone [phone] [facility] Phone [phone] 06/01/2024 1:05 PM

## 2024-06-01 NOTE — Anesthesia Preprocedure Evaluation (Signed)
 "                                  Anesthesia Evaluation  Patient identified by MRN, date of birth, ID band Patient awake    Reviewed: Allergy & Precautions, NPO status , Patient's Chart, lab work & pertinent test results  History of Anesthesia Complications (+) PONV and history of anesthetic complications (remote hx PONV 1975, no issues since then)  Airway Mallampati: I  TM Distance: >3 FB Neck ROM: Full    Dental  (+) Teeth Intact, Dental Advisory Given   Pulmonary neg pulmonary ROS   Pulmonary exam normal breath sounds clear to auscultation       Cardiovascular hypertension (113/65 preop), Pt. on medications pulmonary hypertension (mild pHTN on echo)+CHF (normal LVEF, mild reduction in RV systolic function)  Normal cardiovascular exam+ dysrhythmias (eliquis  LD 1/24) Atrial Fibrillation + Valvular Problems/Murmurs (s/p MVR 2021, mod TR)  Rhythm:Regular Rate:Normal  Echo 2024  1. Left ventricular ejection fraction, by estimation, is 55%. The left  ventricle has normal function. The left ventricle has no regional wall  motion abnormalities. Left ventricular diastolic parameters are  indeterminate.   2. Right ventricular systolic function is mildly reduced. The right  ventricular size is mildly enlarged. There is mildly elevated pulmonary  artery systolic pressure. The estimated right ventricular systolic  pressure is 43.2 mmHg.   3. Left atrial size was severely dilated.   4. Right atrial size was severely dilated.   5. The mitral valve has been repaired/replaced. Trivial mitral valve  regurgitation. No evidence of mitral stenosis. The mean mitral valve  gradient is 4.0 mmHg at a HR 47bpm. There is a 36 mm Medtronic Sinuform  annuloplasty ring present in the mitral  position.   6. Tricuspid valve regurgitation is moderate.   7. The aortic valve is tricuspid. Aortic valve regurgitation is trivial.  Aortic valve sclerosis is present, with no evidence of aortic  valve  stenosis.   8. Aortic dilatation noted. There is borderline dilatation of the aortic  root, measuring 38 mm.   9. The inferior vena cava is normal in size with greater than 50%  respiratory variability, suggesting right atrial pressure of 3 mmHg.   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  Horizontal ST segment depression ST segment depression of 1 mm was noted during stress in the III, II, aVF, V6 and V5 leads, beginning at 4 minutes of stress, and returning to baseline after 1-5 minutes of recovery.   Abnormal exercise stress test. Exaggerated heart rate response due to atrial fibrillation. There is borderline ST segment depression during exercise that resolves fairly rapidly in recovery. Although digoxin  effect is not seen on the baseline tracing, cannot exclude false positive response due to digoxin . - Reviewed by Dr. Annabella Scarce, Stress test discussed in clinic.  There is mention of possible digoxin  effect but he was not on digoxin  at the time.  Continue with current plan.     RHC/LHC (PRE-MR REPAIR) 11/18/2019: 1.  Calcified nonobstructive proximal LAD stenosis 2.  Patent coronary arteries with mild diffuse irregularity and no flow-limiting coronary stenoses 3.  Normal right heart filling pressures  with preserved cardiac output 4.  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   Anesthesia Other Findings   Reproductive/Obstetrics negative OB ROS                              Anesthesia Physical Anesthesia Plan  ASA: 3  Anesthesia Plan: General   Post-op Pain Management: Tylenol  PO (pre-op)*   Induction: Intravenous  PONV Risk Score and Plan: 3 and Ondansetron , Dexamethasone , Midazolam  and Treatment may vary due to age or medical condition  Airway Management Planned: Oral ETT  Additional Equipment: None  Intra-op Plan:   Post-operative Plan: Extubation in OR  Informed Consent: I have reviewed the patients History and Physical, chart, labs and discussed the procedure including the risks, benefits and alternatives for the proposed anesthesia with the patient or authorized representative who has indicated his/her understanding and acceptance.     Dental advisory given  Plan Discussed with: CRNA  Anesthesia Plan Comments: ( )         Anesthesia Quick Evaluation  "

## 2024-06-02 ENCOUNTER — Ambulatory Visit
Admission: RE | Admit: 2024-06-02 | Discharge: 2024-06-02 | Disposition: A | Source: Ambulatory Visit | Attending: Surgery | Admitting: Surgery

## 2024-06-02 ENCOUNTER — Inpatient Hospital Stay

## 2024-06-02 ENCOUNTER — Inpatient Hospital Stay: Admitting: Hematology and Oncology

## 2024-06-02 DIAGNOSIS — D0511 Intraductal carcinoma in situ of right breast: Secondary | ICD-10-CM

## 2024-06-03 ENCOUNTER — Encounter (HOSPITAL_COMMUNITY): Payer: Self-pay | Admitting: Anesthesiology

## 2024-06-03 ENCOUNTER — Encounter (HOSPITAL_COMMUNITY): Payer: Self-pay | Admitting: Vascular Surgery

## 2024-06-03 ENCOUNTER — Other Ambulatory Visit: Payer: Self-pay

## 2024-06-03 ENCOUNTER — Ambulatory Visit
Admission: RE | Admit: 2024-06-03 | Discharge: 2024-06-03 | Disposition: A | Source: Ambulatory Visit | Attending: Surgery | Admitting: Surgery

## 2024-06-03 ENCOUNTER — Encounter (HOSPITAL_COMMUNITY): Payer: Self-pay | Admitting: Surgery

## 2024-06-03 ENCOUNTER — Ambulatory Visit (HOSPITAL_COMMUNITY)
Admission: RE | Admit: 2024-06-03 | Discharge: 2024-06-03 | Disposition: A | Discharge status: Home / Self Care | Attending: Surgery | Admitting: Surgery

## 2024-06-03 ENCOUNTER — Encounter (HOSPITAL_COMMUNITY): Admission: RE | Disposition: A | Payer: Self-pay | Source: Home / Self Care | Attending: Surgery

## 2024-06-03 DIAGNOSIS — I10 Essential (primary) hypertension: Secondary | ICD-10-CM | POA: Diagnosis not present

## 2024-06-03 DIAGNOSIS — D0511 Intraductal carcinoma in situ of right breast: Secondary | ICD-10-CM | POA: Diagnosis present

## 2024-06-03 DIAGNOSIS — E785 Hyperlipidemia, unspecified: Secondary | ICD-10-CM | POA: Diagnosis not present

## 2024-06-03 DIAGNOSIS — Z79899 Other long term (current) drug therapy: Secondary | ICD-10-CM | POA: Diagnosis not present

## 2024-06-03 DIAGNOSIS — I2510 Atherosclerotic heart disease of native coronary artery without angina pectoris: Secondary | ICD-10-CM | POA: Insufficient documentation

## 2024-06-03 DIAGNOSIS — M1990 Unspecified osteoarthritis, unspecified site: Secondary | ICD-10-CM | POA: Diagnosis not present

## 2024-06-03 DIAGNOSIS — K219 Gastro-esophageal reflux disease without esophagitis: Secondary | ICD-10-CM | POA: Insufficient documentation

## 2024-06-03 MED ORDER — GABAPENTIN 300 MG PO CAPS: 300.0000 mg | ORAL_CAPSULE | ORAL | Status: AC

## 2024-06-03 MED ORDER — FENTANYL CITRATE (PF) 100 MCG/2ML IJ SOLN
INTRAMUSCULAR | Status: AC
  Filled 2024-06-03: qty 2

## 2024-06-03 MED ORDER — BUPIVACAINE-EPINEPHRINE 0.25% -1:200000 IJ SOLN
INTRAMUSCULAR | Status: DC | PRN
  Administered 2024-06-03: 16 mL

## 2024-06-03 MED ORDER — PROPOFOL 10 MG/ML IV BOLUS
INTRAVENOUS | Status: DC | PRN
  Administered 2024-06-03: 200 mg via INTRAVENOUS

## 2024-06-03 MED ORDER — FENTANYL CITRATE (PF) 250 MCG/5ML IJ SOLN
INTRAMUSCULAR | Status: DC | PRN
  Administered 2024-06-03: 50 ug via INTRAVENOUS

## 2024-06-03 MED ORDER — LIDOCAINE 2% (20 MG/ML) 5 ML SYRINGE
INTRAMUSCULAR | Status: AC
  Filled 2024-06-03: qty 5

## 2024-06-03 MED ORDER — OXYCODONE HCL 5 MG PO TABS: 5.0000 mg | ORAL_TABLET | Freq: Once | ORAL | Status: AC | PRN

## 2024-06-03 MED ORDER — HYDRALAZINE HCL 20 MG/ML IJ SOLN
10.0000 mg | Freq: Once | INTRAMUSCULAR | Status: AC
  Administered 2024-06-03: 10 mg via INTRAVENOUS

## 2024-06-03 MED ORDER — SUGAMMADEX SODIUM 200 MG/2ML IV SOLN
INTRAVENOUS | Status: DC | PRN
  Administered 2024-06-03: 200 mg via INTRAVENOUS

## 2024-06-03 MED ORDER — ACETAMINOPHEN 500 MG PO TABS
ORAL_TABLET | ORAL | Status: AC
  Administered 2024-06-03: 1000 mg via ORAL
  Filled 2024-06-03: qty 2

## 2024-06-03 MED ORDER — LIDOCAINE 2% (20 MG/ML) 5 ML SYRINGE
INTRAMUSCULAR | Status: DC | PRN
  Administered 2024-06-03: 100 mg via INTRAVENOUS

## 2024-06-03 MED ORDER — DEXAMETHASONE SOD PHOSPHATE PF 10 MG/ML IJ SOLN
INTRAMUSCULAR | Status: DC | PRN
  Administered 2024-06-03: 5 mg via INTRAVENOUS

## 2024-06-03 MED ORDER — ROCURONIUM BROMIDE 10 MG/ML (PF) SYRINGE
PREFILLED_SYRINGE | INTRAVENOUS | Status: DC | PRN
  Administered 2024-06-03: 50 mg via INTRAVENOUS

## 2024-06-03 MED ORDER — CHLORHEXIDINE GLUCONATE 0.12 % MT SOLN: 15.0000 mL | Freq: Once | OROMUCOSAL | Status: AC

## 2024-06-03 MED ORDER — FENTANYL CITRATE (PF) 100 MCG/2ML IJ SOLN: 25.0000 ug | INTRAMUSCULAR | Status: DC | PRN

## 2024-06-03 MED ORDER — CHLORHEXIDINE GLUCONATE CLOTH 2 % EX PADS: 6.0000 | MEDICATED_PAD | Freq: Once | CUTANEOUS | Status: DC

## 2024-06-03 MED ORDER — 0.9 % SODIUM CHLORIDE (POUR BTL) OPTIME
TOPICAL | Status: DC | PRN
  Administered 2024-06-03: 1000 mL

## 2024-06-03 MED ORDER — OXYCODONE HCL 5 MG/5ML PO SOLN
ORAL | Status: AC
  Filled 2024-06-03: qty 5

## 2024-06-03 MED ORDER — PROPOFOL 10 MG/ML IV BOLUS
INTRAVENOUS | Status: AC
  Filled 2024-06-03: qty 20

## 2024-06-03 MED ORDER — CLINDAMYCIN PHOSPHATE 900 MG/50ML IV SOLN
INTRAVENOUS | Status: AC
  Filled 2024-06-03: qty 50

## 2024-06-03 MED ORDER — GABAPENTIN 300 MG PO CAPS
ORAL_CAPSULE | ORAL | Status: AC
  Administered 2024-06-03: 300 mg via ORAL
  Filled 2024-06-03: qty 1

## 2024-06-03 MED ORDER — CLINDAMYCIN PHOSPHATE 900 MG/50ML IV SOLN
900.0000 mg | INTRAVENOUS | Status: AC
  Administered 2024-06-03: 900 mg via INTRAVENOUS

## 2024-06-03 MED ORDER — OXYCODONE HCL 5 MG/5ML PO SOLN
5.0000 mg | Freq: Once | ORAL | Status: AC | PRN
  Administered 2024-06-03: 5 mg via ORAL

## 2024-06-03 MED ORDER — HYDRALAZINE HCL 20 MG/ML IJ SOLN
INTRAMUSCULAR | Status: AC
  Filled 2024-06-03: qty 1

## 2024-06-03 MED ORDER — ONDANSETRON HCL 4 MG/2ML IJ SOLN: 4.0000 mg | Freq: Once | INTRAMUSCULAR | Status: DC | PRN

## 2024-06-03 MED ORDER — CHLORHEXIDINE GLUCONATE 0.12 % MT SOLN
OROMUCOSAL | Status: AC
  Administered 2024-06-03: 15 mL via OROMUCOSAL
  Filled 2024-06-03: qty 15

## 2024-06-03 MED ORDER — ONDANSETRON HCL 4 MG/2ML IJ SOLN
INTRAMUSCULAR | Status: DC | PRN
  Administered 2024-06-03: 4 mg via INTRAVENOUS

## 2024-06-03 MED ORDER — OXYCODONE HCL 5 MG PO TABS: 5.0000 mg | ORAL_TABLET | Freq: Four times a day (QID) | ORAL | 0 refills | Status: AC | PRN

## 2024-06-03 MED ORDER — LACTATED RINGERS IV SOLN: INTRAVENOUS | Status: DC

## 2024-06-03 MED ORDER — MEPERIDINE HCL 25 MG/ML IJ SOLN: 6.2500 mg | INTRAMUSCULAR | Status: DC | PRN

## 2024-06-03 MED ORDER — DEXAMETHASONE SOD PHOSPHATE PF 10 MG/ML IJ SOLN
INTRAMUSCULAR | Status: AC
  Filled 2024-06-03: qty 1

## 2024-06-03 MED ORDER — ONDANSETRON HCL 4 MG/2ML IJ SOLN
INTRAMUSCULAR | Status: AC
  Filled 2024-06-03: qty 2

## 2024-06-03 MED ORDER — MIDAZOLAM HCL 2 MG/2ML IJ SOLN
INTRAMUSCULAR | Status: AC
  Filled 2024-06-03: qty 2

## 2024-06-03 MED ORDER — ORAL CARE MOUTH RINSE: 15.0000 mL | Freq: Once | OROMUCOSAL | Status: AC

## 2024-06-03 MED ORDER — PHENYLEPHRINE 80 MCG/ML (10ML) SYRINGE FOR IV PUSH (FOR BLOOD PRESSURE SUPPORT)
PREFILLED_SYRINGE | INTRAVENOUS | Status: AC
  Filled 2024-06-03: qty 10

## 2024-06-03 MED ORDER — ACETAMINOPHEN 500 MG PO TABS: 1000.0000 mg | ORAL_TABLET | ORAL | Status: AC

## 2024-06-03 NOTE — Op Note (Signed)
 Preoperative diagnosis: Right breast DCIS upper inner quadrant  Postoperative diagnosis: Same  Procedure: Right breast seed localized lumpectomy  Surgeon: Debby Shipper, MD  Anesthesia: LMA with 0.25% Marcaine  with epinephrine   EBL: Minimal  Specimen: Right breast tissue with seed and clip verified by Faxitron oriented with ink sent to pathology  Indications for procedure: The patient is a 74 year old female with a right breast mammographic abnormality.  Core biopsy showed DCIS.  She was seen in a multidisciplinary clinic setting and lumpectomy recommended.The procedure has been discussed with the patient. Alternatives to surgery have been discussed with the patient.  Risks of surgery include bleeding,  Infection,  Seroma formation, death,  and the need for further surgery.   The patient understands and wishes to proceed.   Description of procedure: The patient was met in the holding area questions were answered.  Of note, a seed was placed as an outpatient.  She was then taken back to the operating room placed supine upon the operating table.  After induction of general anesthesia, right breast was prepped and draped in a sterile fashion and timeout performed.  Proper patient, site and procedure verified.  Neoprobe was used to identify the seed right breast upper inner quadrant.  A transverse incision was made dissection was carried down all tissue and the seed and clip were excised with grossly negative margins.  Irrigation was used.  Local anesthetic infiltrated.  Images of the specimen revealed the seed and clip to be in the specimen.  After ensuring hemostasis the deep tissue planes were approximated with 3-0 Vicryl.  4 Monocryl was used to close the skin in a subcuticular fashion.  Dermabond applied.  Breast binder placed.  All counts found to be correct.  The patient was then awoke extubated taken recovery in satisfactory condition.

## 2024-06-03 NOTE — Progress Notes (Signed)
 Unable to remove rings- left hand. Dr. Mallory is aware

## 2024-06-03 NOTE — Interval H&P Note (Signed)
 History and Physical Interval Note:  06/03/2024 1:11 PM  [name] [name]  has presented today for surgery, with the diagnosis of RIGHT DCIS.  The various methods of treatment have been discussed with the patient and family. After consideration of risks, benefits and other options for treatment, the patient has consented to  Procedures with comments: BREAST LUMPECTOMY WITH RADIOACTIVE SEED LOCALIZATION (Right) - RIGHT BREAST SEED LUMPECTOMY as a surgical intervention.  The patient's history has been reviewed, patient examined, no change in status, stable for surgery.  I have reviewed the patient's chart and labs.  Questions were answered to the patient's satisfaction.     Crystol Walpole A Ellar Hakala

## 2024-06-03 NOTE — H&P (Signed)
 istory of Present Illness: [name] [name] is a 74 y.o. female who is seen today as an office consultation for evaluation of New Consultation  The patient presents for evaluation of abnormal mammogram. She had a 5 cm cluster of pleomorphic calcifications lower outer quadrant noted in the right breast. Core biopsy showed DCIS. No history of breast pain, breast mass or nipple discharge.  Review of Systems: A complete review of systems was obtained from the patient. I have reviewed this information and discussed as appropriate with the patient. See HPI as well for other ROS.    Medical History: Past Medical History:  Diagnosis Date  Anemia  Arthritis  DVT (deep venous thrombosis) (CMS/HHS-HCC)  GERD (gastroesophageal reflux disease)  Glaucoma (increased eye pressure)  Hypertension   There is no problem list on file for this patient.  Past Surgical History:  Procedure Laterality Date  HYSTERECTOMY    Allergies  Allergen Reactions  Rosuvastatin Other (See Comments)  SEVER HEAD PAIN  SEVER HEAD PAIN   rosuvastatin  Penicillins Other (See Comments)   Current Outpatient Medications on File Prior to Visit  Medication Sig Dispense Refill  acetaminophen  (TYLENOL ) 650 MG ER tablet Take 1,300 mg by mouth every 8 (eight) hours as needed for Pain  ascorbic acid, vitamin C, 100 mg Chew Take 100 mg by mouth  aspirin  81 mg tablet Take 81 mg by mouth once daily  atorvastatin  (LIPITOR) 80 MG tablet  bisoproloL -hydroCHLOROthiazide  (ZIAC ) 5-6.25 mg tablet Take 2 tablets by mouth once daily  clotrimazole-betamethasone (LOTRISONE) 1-0.05 % cream APPLY TOPICALLY TO THE AFFECTED AND SURROUNDING AREAS TWICE DAILY IN THE MORNING AND IN THE EVENING AS NEEDED  ezetimibe (ZETIA) 10 mg tablet Take 10 mg by mouth once daily  famotidine (PEPCID) 20 MG tablet TAKE 1 TABLET BY MOUTH TWICE DAILY AS NEEDED FOR REFLUX SYMTPOMS  felodipine  (PLENDIL ) 10 MG ER tablet  fluticasone furoate-vilanteroL (BREO ELLIPTA )  100-25 mcg/dose DsDv inhaler INHALE 1 PUFF BY MOUTH AT THE SAME TIME EVERY DAY  gabapentin  (NEURONTIN ) 100 MG capsule TAKE 1 CAPSULE BY MOUTH EVERY 8 HOURS AS NEEDED FOR TINGLING/BURNING IN LEG/FEET  ibandronate (BONIVA) 150 mg tablet 1 PO Q MONTH  linaCLOtide (LINZESS) 145 mcg capsule Take 145 mcg by mouth  LUMIGAN 0.01 % ophthalmic solution PLACE 1 DROP IN LEFT EYE AT BEDTIME  methocarbamoL (ROBAXIN) 500 MG tablet TAKE 1 TO 2 TABLETS BY MOUTH EVERY 6 TO 8 HOURS AS NEEDED FOR SPASMS OR MUSCLE TENSION  PREVIDENT 5000 SENSITIVE 1.1-5 % USE AS DIRECTED BY PROVIDER  solifenacin  (VESICARE ) 10 MG tablet Take 10 mg by mouth once daily  sucralfate  (CARAFATE ) 1 gram tablet TAKE 1 TABLET BY MOUTH FOUR TIMES DAILY AS NEEDED FOR REFLUX OR SYMPTOMS  vitamin E 400 UNIT capsule Take 400 Units by mouth once daily  XIIDRA 5 % ophthalmic solution Place 1 drop into both eyes 2 (two) times daily   No current facility-administered medications on file prior to visit.   Family History  Problem Relation Age of Onset  Obesity Mother  High blood pressure (Hypertension) Mother  Diabetes Mother  High blood pressure (Hypertension) Father  Diabetes Father  Obesity Sister  Heart valve disease Brother  High blood pressure (Hypertension) Brother  Stroke Son    Social History   Tobacco Use  Smoking Status Never  Smokeless Tobacco Never    Social History   Socioeconomic History  Marital status: Widowed  Tobacco Use  Smoking status: Never  Smokeless tobacco: Never  Substance and Sexual  Activity  Alcohol use: Never  Drug use: Never   Social Drivers of Corporate Investment Banker Strain: Low Risk (09/23/2023)  Received from [facility]  Overall Financial Resource Strain (CARDIA)  Difficulty of Paying Living Expenses: Not hard at all  Food Insecurity: No Food Insecurity (09/23/2023)  Received from [facility]  Hunger Vital Sign  Within the past 12 months, you worried that your food would run out  before you got the money to buy more.: Never true  Within the past 12 months, the food you bought just didn't last and you didn't have money to get more.: Never true  Transportation Needs: No Transportation Needs (09/23/2023)  Received from [facility] - Transportation  Lack of Transportation (Medical): No  Lack of Transportation (Non-Medical): No  Housing Stability: Unknown (05/11/2024)  Housing Stability Vital Sign  Homeless in the Last Year: No   Objective:   Vitals:  05/11/24 0904 05/11/24 0905  BP: (!) 157/90  Pulse: 102  Temp: 36.5 C (97.7 F)  SpO2: 95%  Weight: (!) 105.9 kg (233 lb 6.4 oz)  Height: 162.6 cm (5' 4)  PainSc: 0-No pain   Body mass index is 40.06 kg/m.  Physical Exam Exam conducted with a chaperone present.  Pulmonary:  Effort: Pulmonary effort is normal.  Chest:  Breasts: Right: Normal.  Left: Normal.  Musculoskeletal:  Cervical back: Normal range of motion.  Lymphadenopathy:  Upper Body:  Right upper body: No axillary adenopathy.  Left upper body: No axillary adenopathy.  Skin: General: Skin is warm.  Neurological:  General: No focal deficit present.  Psychiatric:  Mood and Affect: Mood normal.     Labs, Imaging and Diagnostic Testing:  FINAL DIAGNOSIS   1. Breast, right, needle core biopsy, stereotactic core biopsy of calcifications, coil clip :  DUCTAL CARCINOMA IN SITU, WITH APOCRINE FEATURES, INTERMEDIATE TO HIGH GRADE  NECROSIS: PRESENT  CALCIFICATIONS: PRESENT  DCIS LENGTH: 1.5 CM  SEE NOTE   Diagnosis Note : Dr. Frutoso reviewed the case and concurs with the  interpretation. A breast prognostic profile (ER and PR) is pending and will be  reported in an addendum. [facility] was notified  on 04/28/2024.   DATE SIGNED OUT: 04/28/2024  ELECTRONIC SIGNATURE : Pepper Dutton Md, Pathologist, Electronic Signature   MICROSCOPIC DESCRIPTION   CASE COMMENTS  STAINS USED IN DIAGNOSIS:  H&E-2   H&E-3  H&E-4  H&E  H&E-2  H&E-3  H&E-4  H&E  H&E-2  H&E-3  H&E-4  H&E  *RECUT 1 SLIDE  Stains used in diagnosis 1 ER-ACIS, 1 PR-ACIS  Estrogen receptor (6F11), immunohistochemical stains are performed on formalin  fixed, paraffin embedded tissue using a 3,3-diaminobenzidine (DAB) chromogen  and Leica Bond Autostainer System. The staining intensity of the nucleus is  scored manually and is reported as the percentage of tumor cell nuclei  demonstrating specific nuclear staining.Specimens are fixed in 10% Neutral  Buffered Formalin for at least 6 hours and up to 72 hours. These tests have not  be validated on decalcified tissue. Results should be interpreted with caution  given the possibility of false negative results on decalcified specimens.  PR progesterone receptor (16), immunohistochemical stains are performed on  formalin fixed, paraffin embedded tissue using a 3,3-diaminobenzidine (DAB)  chromogen and Leica Bond Autostainer System. The staining intensity of the  nucleus is scored manually and is reported as the percentage of tumor cell  nuclei demonstrating specific nuclear staining.Specimens are fixed in 10%  Neutral Buffered Formalin for at least 6 hours and up to 72 hours. These tests  have not be validated on decalcified tissue. Results should be interpreted with  caution given the possibility of false negative results on decalcified  specimens.   ADDENDUM  1) Breast, right, needle core biopsy, stereotactic core biopsy of calcifications, coil clip  PROGNOSTIC INDICATORS   Results:  IMMUNOHISTOCHEMICAL AND MORPHOMETRIC ANALYSIS PERFORMED MANUALLY  Estrogen Receptor: 30%, POSITIVE, WEAK STAINING INTENSITY  Progesterone Receptor: 0%, NEGATIVE  COMMENT: The negative hormone receptor study(ies) in this case has no internal positive control.   REFERENCE RANGE ESTROGEN RECEPTOR  NEGATIVE 0%  POSITIVE =>1%  REFERENCE RANGE PROGESTERONE RECEPTOR  NEGATIVE 0%   POSITIVE =>1%  All controls stained appropriately  Belvie Come, John, Pathologist, Electronic Signature  ( Signed 12 24 2025)   CLINICAL HISTORY   SPECIMEN(S) OBTAINED  1. Breast, right, needle core biopsy, Stereotactic Core Biopsy Of  Calcifications, Coil Clip   SPECIMEN COMMENTS:  1. TIF: 12:00, CIT: 4 minutes  SPECIMEN CLINICAL INFORMATION:  1. Linear calcifications  CLINICAL DATA: 74 year old woman recalled for RIGHT breast  calcifications.   EXAM:  DIGITAL DIAGNOSTIC UNILATERAL RIGHT MAMMOGRAM   TECHNIQUE:  Right digital diagnostic mammography was performed.   COMPARISON: Previous exam(s).   ACR Breast Density Category b: There are scattered areas of  fibroglandular density.   FINDINGS:  RIGHT:   Mammogram: Full field ML and spot magnification CC and ML views of  the RIGHT breast were obtained.   Linearly distributed coarse heterogeneous calcifications are seen in  the posterior depth of the lower inner RIGHT breast, spanning  approximately 5.3 cm.   IMPRESSION:  Indeterminate linearly distributed calcifications seen at the  posterior depth of the lower inner RIGHT breast, spanning  approximately 5.3 cm.   RECOMMENDATION:  Stereotactic guided biopsy of anterior and posterior extents of  RIGHT lower inner breast calcifications.   I have discussed the findings and recommendations with the patient.  The biopsy procedure was explained to the patient and questions were  answered. Patient expressed their understanding of the biopsy  recommendation.   Patient will be scheduled for biopsy at her earliest convenience by  the schedulers.   Ordering provider will be notified. If applicable, a reminder letter  will be sent to the patient regarding the next appointment.   BI-RADS CATEGORY 4: Suspicious.   Electronically Signed  By: Aliene Lloyd M.D.   Assessment and Plan:   Diagnoses and all orders for this visit:  Ductal carcinoma in situ (DCIS) of right  breast - Ambulatory Referral to Oncology-Medical - Ambulatory Referral to Radiation Oncology   Discussed breast conserving surgery as well as mastectomy with reconstruction.  Discussed the pathophysiology of DCIS as well as the potential need for adjuvant therapies  She is opted for right breast seed localized lumpectomy.  Discussed long-term survival, local regional recurrence, quality of life with different surgical approaches including cosmesis.  The procedure has been discussed with the patient. Alternatives to surgery have been discussed with the patient. Risks of surgery include bleeding, Infection, Seroma formation, death, and the need for further surgery. The patient understands and wishes to proceed.    DEBBY CURTISTINE SHIPPER, MD

## 2024-06-03 NOTE — Discharge Instructions (Signed)

## 2024-06-03 NOTE — Transfer of Care (Signed)
 Immediate Anesthesia Transfer of Care Note  Patient: [name] [name]  Procedure(s) Performed: BREAST LUMPECTOMY WITH RADIOACTIVE SEED LOCALIZATION (Right: Breast)  Patient Location: PACU  Anesthesia Type:General  Level of Consciousness: sedated and responds to stimulation  Airway & Oxygen Therapy: Patient Spontanous Breathing and Patient connected to face mask oxygen  Post-op Assessment: Report given to RN and Post -op Vital signs reviewed and unstable, Anesthesiologist notified  Post vital signs: stable  Last Vitals:  Vitals Value Taken Time  BP 156/89 06/03/24 14:30  Temp 36.4 C 06/03/24 14:30  Pulse 53 06/03/24 14:35  Resp 21 06/03/24 14:35  SpO2 96 % 06/03/24 14:35  Vitals shown include unfiled device data.  Last Pain:  Vitals:   06/03/24 1225  TempSrc:   PainSc: 0-No pain         Complications: No notable events documented.

## 2024-06-03 NOTE — Anesthesia Postprocedure Evaluation (Signed)
"   Anesthesia Post Note  Patient: [name] [name]  Procedure(s) Performed: BREAST LUMPECTOMY WITH RADIOACTIVE SEED LOCALIZATION (Right: Breast)     Patient location during evaluation: PACU Anesthesia Type: General Level of consciousness: awake and alert Pain management: pain level controlled Vital Signs Assessment: post-procedure vital signs reviewed and stable Respiratory status: spontaneous breathing, nonlabored ventilation, respiratory function stable and patient connected to nasal cannula oxygen Cardiovascular status: blood pressure returned to baseline and stable Postop Assessment: no apparent nausea or vomiting Anesthetic complications: no   No notable events documented.  Last Vitals:  Vitals:   06/03/24 1207 06/03/24 1430  BP: 139/80 (!) 156/89  Pulse: (!) 59 (!) 58  Resp: 18 (!) 21  Temp: 36.8 C 36.4 C  SpO2: 96% 96%    Last Pain:  Vitals:   06/03/24 1225  TempSrc:   PainSc: 0-No pain                 Eren Puebla      "

## 2024-06-04 ENCOUNTER — Encounter (HOSPITAL_COMMUNITY): Payer: Self-pay | Admitting: Surgery

## 2024-06-05 ENCOUNTER — Encounter: Payer: Self-pay | Admitting: *Deleted

## 2024-06-05 DIAGNOSIS — D0511 Intraductal carcinoma in situ of right breast: Secondary | ICD-10-CM

## 2024-06-08 LAB — SURGICAL PATHOLOGY

## 2024-06-09 ENCOUNTER — Ambulatory Visit: Payer: Self-pay | Admitting: Surgery

## 2024-06-09 ENCOUNTER — Encounter: Payer: Self-pay | Admitting: *Deleted

## 2024-06-12 ENCOUNTER — Inpatient Hospital Stay: Admitting: Hematology and Oncology

## 2024-06-12 ENCOUNTER — Inpatient Hospital Stay

## 2024-06-12 VITALS — BP 138/74 | HR 56 | Temp 98.0°F | Resp 18 | Ht 64.0 in | Wt 237.9 lb

## 2024-06-12 DIAGNOSIS — D0511 Intraductal carcinoma in situ of right breast: Secondary | ICD-10-CM

## 2024-06-12 NOTE — Progress Notes (Signed)
 [facility] CONSULT NOTE  Patient Care Team: Campbell Reynolds, NP as PCP - General Lavona Agent, MD as Consulting Physician (Cardiology) Gerome Devere HERO, RN as Oncology Nurse Navigator  CHIEF COMPLAINTS/PURPOSE OF CONSULTATION:  Newly diagnosed breast cancer  HISTORY OF PRESENTING ILLNESS:  [name] [name] 74 y.o. female is here because of recent diagnosis of right breast DCIS  I reviewed her records extensively and collaborated the history with the patient.  SUMMARY OF ONCOLOGIC HISTORY: Oncology History   No problem history exists.    Discussed the use of AI scribe software for clinical note transcription with the patient, who gave verbal consent to proceed.  History of Present Illness [name] [name] is a 74 year old female with stage 0 right breast ductal carcinoma in situ, status post biopsy and lumpectomy, who presents for oncology follow-up to discuss adjuvant management.  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 Biopsy confirmed DCIS, and lumpectomy on June 03, 2024, excised the area of concern. Final pathology demonstrated no residual malignancy. She has not required pain medication postoperatively and has not experienced significant side effects from the procedure.  The tumor exhibited weak estrogen receptor positivity. The role of adjuvant endocrine therapy was discussed. She is scheduled to meet with radiation oncology on June 25, 2024. She expresses concern about recurrence and prefers a comprehensive approach to risk reduction.  She has a strong family history of breast and ovarian cancer, including a sister who died of ovarian cancer, a maternal aunt who is a long-term breast cancer survivor, and a cousin who died of breast cancer at age 76. She is scheduled for genetic counseling and testing on June 17, 2024. She is coordinating her radiation therapy schedule around a planned family trip in early March and is aware of the need for consecutive treatments once started.  She describes ongoing fatigue, which was particularly severe in 2020-2021 following multiple family bereavements, but currently attributes her fatigue and recent weight gain to stress and dietary changes since her cancer diagnosis. She is prediabetic, follows dietary restrictions, and is working with a nutritionist. She uses a rescue inhaler for intermittent dyspnea, which has improved with weight management and specialist evaluation. No new respiratory or constitutional symptoms are reported.  Rest of the pertinent 10 point ROS reviewed and neg.  MEDICAL HISTORY:  Past Medical History:  Diagnosis Date   Anemia    Arthritis    Blood in stool    GI consult 2012, EGD and colonoscopy   CAD (coronary artery disease)    Eczema    GERD (gastroesophageal reflux disease)    H/O bone density study 07/09/2006   [facility]openia; [facility]   Hemorrhoid    internal and external, general surgery consult 04/2011   Hyperlipidemia    Hypertension    Osteopenia     SURGICAL HISTORY: Past Surgical History:  Procedure Laterality Date   ABDOMINAL HYSTERECTOMY     BREAST BIOPSY Right 04/27/2024   MM RT BREAST BX W LOC DEV 1ST LESION IMAGE BX SPEC STEREO GUIDE 04/27/2024 [facility]   BREAST BIOPSY  06/02/2024   MM RT RADIOACTIVE SEED LOC MAMMO GUIDE 06/02/2024 [facility]   BREAST LUMPECTOMY WITH RADIOACTIVE SEED LOCALIZATION Right 06/03/2024   Procedure: BREAST LUMPECTOMY WITH RADIOACTIVE SEED LOCALIZATION;  Surgeon: Vanderbilt Ned, MD;  Location: [facility] OR;  Service: General;  Laterality: Right;  RIGHT BREAST SEED LUMPECTOMY   COLONOSCOPY  2012, 2008   Dr. Kristie   DILATION AND CURETTAGE OF UTERUS     ESOPHAGOGASTRODUODENOSCOPY  2012, 2008   Dr. Kristie    SOCIAL HISTORY: Social History   Socioeconomic History    Marital status: Widowed    Spouse name: Not on file   Number of children: Not on file   Years of education: Not on file   Highest education level: Not on file  Occupational History   Occupation: [employer]    Comment: Retired   Tobacco Use   Smoking status: Never   Smokeless tobacco: Never  Vaping Use   Vaping status: Never Used  Substance and Sexual Activity   Alcohol use: No   Drug use: No   Sexual activity: Not Currently  Other Topics Concern   Not on file  Social History Narrative   Widow.  Retired from [employer].   Three grand.     Social Drivers of Health   Tobacco Use: Low Risk (06/03/2024)   Patient History    Smoking Tobacco Use: Never    Smokeless Tobacco Use: Never    Passive Exposure: Not on file  Financial Resource Strain: Low Risk (09/23/2023)   Received from [facility]   Overall Financial Resource Strain (CARDIA)    Difficulty of Paying Living Expenses: Not hard at all  Food Insecurity: No Food Insecurity (06/12/2024)   Epic    Worried About Radiation Protection Practitioner of Food in the Last Year: Never true    Ran Out of Food in the Last Year: Never true  Transportation Needs: No Transportation Needs (06/12/2024)   Epic    Lack of Transportation (Medical): No    Lack of Transportation (Non-Medical): No  Physical Activity: Not on file  Stress: Not on file  Social Connections: Not on file  Intimate Partner Violence: Not At Risk (06/12/2024)   Epic    Fear of Current or Ex-Partner: No    Emotionally Abused: No    Physically Abused: No    Sexually Abused: No  Depression (PHQ2-9): Low Risk (06/12/2024)   Depression (PHQ2-9)    PHQ-2 Score: 1  Alcohol Screen: Not on file  Housing: Low Risk (06/12/2024)   Epic    Unable to Pay for Housing in the Last Year: No    Number of Times Moved in the Last Year: 0    Homeless in the Last Year: No  Utilities: Not At Risk (06/12/2024)   Epic    Threatened with loss of utilities: No  Health Literacy: Not on file    FAMILY  HISTORY: Family History  Problem Relation Age of Onset   Heart disease Mother        Pacemaker   Diabetes Mother    Cancer Father        lung   Diabetes Father    Diabetes Sister    Heart disease Sister    Hypertension Sister    Diabetes Brother    Heart disease Brother        1 brother died MI age 4 yo   Hypertension Brother    Stroke Brother    Cancer Other        maternal side,non first degree breast cancer   Hypertension Brother    Diabetes Brother    Heart disease Brother    Heart disease Brother    Hypertension Brother    Diabetes Brother    Hypertension Sister    Hypertension  Sister    Diabetes Sister    Diabetes Sister    Hypertension Sister    Hypertension Sister    Diabetes Sister     ALLERGIES:  is allergic to crestor [rosuvastatin] and penicillins.  MEDICATIONS:  Current Outpatient Medications  Medication Sig Dispense Refill   acetaminophen  (TYLENOL ) 650 MG CR tablet Take 1,300 mg by mouth every 8 (eight) hours as needed for pain.     Ascorbic Acid (VITAMIN C) 100 MG CHEW Chew 100 mg by mouth daily.     aspirin  81 MG tablet Take 81 mg by mouth daily.     atorvastatin  (LIPITOR) 80 MG tablet TAKE 1 TABLET(80 MG) BY MOUTH DAILY 90 tablet 3   bimatoprost (LUMIGAN) 0.01 % SOLN Place 1 drop into the left eye at bedtime.     bisoprolol -hydrochlorothiazide  (ZIAC ) 5-6.25 MG tablet TAKE 2 TABLETS BY MOUTH DAILY 180 tablet 0   calcium  carbonate (TUMS - DOSED IN MG ELEMENTAL CALCIUM ) 500 MG chewable tablet Chew 1 tablet by mouth 2 (two) times daily as needed for indigestion or heartburn.     CALCIUM  PO Take 1 tablet by mouth daily.     cholecalciferol (VITAMIN D3) 25 MCG (1000 UNIT) tablet Take 1,000 Units by mouth daily.     CRANBERRY PO Take 1 tablet by mouth daily.     cyanocobalamin (VITAMIN B12) 500 MCG tablet Take 500 mcg by mouth daily.     estradiol (ESTRACE) 0.1 MG/GM vaginal cream Place 1 Applicatorful vaginally daily as needed (Vaginal irritation).      ezetimibe (ZETIA) 10 MG tablet Take 10 mg by mouth daily.     famotidine (PEPCID) 20 MG tablet Take 20 mg by mouth in the morning and at bedtime.     felodipine  (PLENDIL ) 10 MG 24 hr tablet TAKE 1 TABLET(10 MG) BY MOUTH DAILY 90 tablet 3   FIBER PO Take 3 Pieces by mouth daily.     fluticasone furoate-vilanterol (BREO ELLIPTA ) 100-25 MCG/INH AEPB INHALE 1 PUFF INTO THE LUNGS DAILY 60 each 5   ibandronate (BONIVA) 150 MG tablet Take 150 mg by mouth every 30 (thirty) days. Take in the morning with a full glass of water, on an empty stomach, and do not take anything else by mouth or lie down for the next 30 min.     Multiple Vitamin (MULTIVITAMIN) capsule Take 2 capsules by mouth daily.     oxyCODONE  (OXY IR/ROXICODONE ) 5 MG immediate release tablet Take 1 tablet (5 mg total) by mouth every 6 (six) hours as needed for severe pain (pain score 7-10). 15 tablet 0   PREVIDENT 5000 SENSITIVE 1.1-5 % GEL Place 1 Application onto teeth in the morning and at bedtime.     solifenacin  (VESICARE ) 10 MG tablet Take 10 mg by mouth daily.     TURMERIC PO Take 1 capsule by mouth daily.     vitamin E 400 UNIT capsule Take 400 Units by mouth daily.     XDEMVY 0.25 % SOLN Place 1 drop into both eyes in the morning and at bedtime. Eyelid     XIIDRA 5 % SOLN Place 1 drop into both eyes 2 (two) times daily.     albuterol  (VENTOLIN  HFA) 108 (90 Base) MCG/ACT inhaler Inhale 2 puffs into the lungs every 6 (six) hours as needed for wheezing or shortness of breath. (Patient not taking: Reported on 06/12/2024) 18 g 11   docusate sodium (COLACE) 100 MG capsule Take 100 mg by mouth daily. (Patient not  taking: Reported on 06/12/2024)     No current facility-administered medications for this visit.    All other systems were reviewed with the patient and are negative.  PHYSICAL EXAMINATION: ECOG PERFORMANCE STATUS: 0 - Asymptomatic  Vitals:   06/12/24 1421  BP: 138/74  Pulse: (!) 56  Resp: 18  Temp: 98 F (36.7 C)  SpO2:  94%   Filed Weights   06/12/24 1421  Weight: 237 lb 14.4 oz (107.9 kg)    GENERAL:alert, no distress and comfortable Right breast healing well.  LABORATORY DATA:  I have reviewed the data as listed Lab Results  Component Value Date   WBC 4.9 05/29/2024   HGB 13.6 05/29/2024   HCT 40.6 05/29/2024   MCV 90.2 05/29/2024   PLT 202 05/29/2024   Lab Results  Component Value Date   NA 140 05/29/2024   K 4.3 05/29/2024   CL 103 05/29/2024   CO2 27 05/29/2024    RADIOGRAPHIC STUDIES: I have personally reviewed the radiological reports and agreed with the findings in the report.  ASSESSMENT AND PLAN:   Assessment and Plan Assessment & Plan Ductal carcinoma in situ of the right breast, post-lumpectomy Post-lumpectomy for stage 0 DCIS with complete excision and no residual disease. Weakly estrogen receptor positive, minimal benefit from endocrine therapy. No chemotherapy indicated. Adjuvant radiation therapy recommended. Significant family history of breast and ovarian cancer. Prefers radiation and surveillance over hormonal therapy. - Discussed adjuvant radiation therapy benefits post-lumpectomy for DCIS, given weak estrogen receptor status and lack of endocrine therapy indication. - Reviewed role of adj anti estrogen therapy, I dont believe she will derive substantial benefit. - Reviewed chemotherapy not needed. - Referred to radiation oncology for adjuvant radiation therapy; appointment February 19th. - Confirmed ongoing annual mammography for surveillance. - Confirmed referral to genetics for evaluation; appointment February 11th. - Sent message to surgeon to clarify incision location versus DCIS area. - Follow-up post-radiation therapy; primary surgical follow-up with breast surgeon.    All questions were answered. The patient knows to call the clinic with any problems, questions or concerns.    Amber Stalls, MD 06/12/24

## 2024-06-17 ENCOUNTER — Inpatient Hospital Stay: Admitting: Genetic Counselor

## 2024-06-17 ENCOUNTER — Inpatient Hospital Stay

## 2024-06-25 ENCOUNTER — Ambulatory Visit

## 2024-06-25 ENCOUNTER — Ambulatory Visit: Admitting: Radiation Oncology

## 2024-06-26 ENCOUNTER — Ambulatory Visit: Admitting: Radiation Oncology
